# Patient Record
Sex: Male | Born: 1945 | Race: Black or African American | Hispanic: No | Marital: Single | Smoking: Former smoker
Health system: Southern US, Community
[De-identification: ages and names within clinical notes are randomized; demographics above are authoritative.]

## PROBLEM LIST (undated history)

## (undated) DIAGNOSIS — C801 Malignant (primary) neoplasm, unspecified: Secondary | ICD-10-CM

## (undated) DIAGNOSIS — J45909 Unspecified asthma, uncomplicated: Secondary | ICD-10-CM

## (undated) HISTORY — PX: OTHER SURGICAL HISTORY: SHX169

---

## 1972-11-20 HISTORY — PX: CATARACT EXTRACTION: SUR2

## 2019-07-11 ENCOUNTER — Other Ambulatory Visit: Payer: Self-pay | Admitting: Physician Assistant

## 2019-07-11 DIAGNOSIS — R7989 Other specified abnormal findings of blood chemistry: Secondary | ICD-10-CM

## 2019-07-11 DIAGNOSIS — R972 Elevated prostate specific antigen [PSA]: Secondary | ICD-10-CM

## 2019-07-16 ENCOUNTER — Encounter: Payer: Self-pay | Admitting: *Deleted

## 2019-07-16 ENCOUNTER — Telehealth: Payer: Self-pay | Admitting: *Deleted

## 2019-07-16 DIAGNOSIS — Z87891 Personal history of nicotine dependence: Secondary | ICD-10-CM

## 2019-07-16 DIAGNOSIS — Z122 Encounter for screening for malignant neoplasm of respiratory organs: Secondary | ICD-10-CM

## 2019-07-16 NOTE — Telephone Encounter (Signed)
Received referral for initial lung cancer screening scan. Contacted patient and obtained smoking history,(former, quit 2014, 36 pack year) as well as answering questions related to screening process. Patient denies signs of lung cancer such as weight loss or hemoptysis. Patient denies comorbidity that would prevent curative treatment if lung cancer were found. Patient is scheduled for shared decision making visit and CT scan on 07/22/19 at 145pm.

## 2019-07-18 ENCOUNTER — Ambulatory Visit
Admission: RE | Admit: 2019-07-18 | Discharge: 2019-07-18 | Disposition: A | Discharge status: Home / Self Care | Payer: Medicare Other | Source: Ambulatory Visit | Attending: Physician Assistant | Admitting: Physician Assistant

## 2019-07-18 ENCOUNTER — Other Ambulatory Visit: Payer: Self-pay

## 2019-07-18 DIAGNOSIS — N281 Cyst of kidney, acquired: Secondary | ICD-10-CM | POA: Insufficient documentation

## 2019-07-18 DIAGNOSIS — R972 Elevated prostate specific antigen [PSA]: Secondary | ICD-10-CM | POA: Diagnosis present

## 2019-07-18 DIAGNOSIS — R7989 Other specified abnormal findings of blood chemistry: Secondary | ICD-10-CM | POA: Diagnosis not present

## 2019-07-21 ENCOUNTER — Ambulatory Visit: Payer: Medicare Other

## 2019-07-22 ENCOUNTER — Ambulatory Visit
Admission: RE | Admit: 2019-07-22 | Discharge: 2019-07-22 | Disposition: A | Discharge status: Home / Self Care | Payer: Medicare Other | Source: Ambulatory Visit | Attending: Nurse Practitioner | Admitting: Nurse Practitioner

## 2019-07-22 ENCOUNTER — Other Ambulatory Visit: Payer: Self-pay

## 2019-07-22 ENCOUNTER — Inpatient Hospital Stay: Payer: Medicare Other | Attending: Nurse Practitioner | Admitting: Oncology

## 2019-07-22 DIAGNOSIS — Z87891 Personal history of nicotine dependence: Secondary | ICD-10-CM | POA: Insufficient documentation

## 2019-07-22 DIAGNOSIS — Z122 Encounter for screening for malignant neoplasm of respiratory organs: Secondary | ICD-10-CM | POA: Insufficient documentation

## 2019-07-22 NOTE — Progress Notes (Signed)
Virtual Visit via Video Note  I connected with@ on 07/22/19 at  1:45 PM EDT by a video enabled telemedicine application and verified that I am speaking with the correct person using two identifiers.  Location: Patient: [facility] Provider: Home   I discussed the limitations of evaluation and management by telemedicine and the availability of in person appointments. The patient expressed understanding and agreed to proceed.  I discussed the assessment and treatment plan with the patient. The patient was provided an opportunity to ask questions and all were answered. The patient agreed with the plan and demonstrated an understanding of the instructions.   The patient was advised to call back or seek an in-person evaluation if the symptoms worsen or if the condition fails to improve as anticipated.   In accordance with CMS guidelines, patient has met eligibility criteria including age, absence of signs or symptoms of lung cancer.  Social History   Tobacco Use  . Smoking status: Former Smoker    Packs/day: 0.75    Years: 48.00    Pack years: 36.00    Types: Cigarettes    Quit date: 2014    Years since quitting: 6.6  Substance Use Topics  . Alcohol use: Not on file  . Drug use: Not on file      A shared decision-making session was conducted prior to the performance of CT scan. This includes one or more decision aids, includes benefits and harms of screening, follow-up diagnostic testing, over-diagnosis, false positive rate, and total radiation exposure.   Counseling on the importance of adherence to annual lung cancer LDCT screening, impact of co-morbidities, and ability or willingness to undergo diagnosis and treatment is imperative for compliance of the program.   Counseling on the importance of continued smoking cessation for former smokers; the importance of smoking cessation for current smokers, and information about tobacco cessation interventions have been given to patient including  [place] and 1800 quit [place] programs.   Written order for lung cancer screening with LDCT has been given to the patient and any and all questions have been answered to the best of my abilities.    Yearly follow up will be coordinated by Burgess Estelle, Thoracic Navigator.  I provided 15 minutes of face-to-face video visit time during this encounter, and > 50% was spent counseling as documented under my assessment & plan.   Jacquelin Hawking, NP

## 2019-07-30 ENCOUNTER — Ambulatory Visit: Payer: Self-pay | Admitting: Urology

## 2019-07-31 ENCOUNTER — Encounter: Payer: Self-pay | Admitting: *Deleted

## 2019-08-06 ENCOUNTER — Other Ambulatory Visit: Payer: Self-pay

## 2019-08-06 ENCOUNTER — Encounter: Payer: Self-pay | Admitting: Urology

## 2019-08-06 ENCOUNTER — Ambulatory Visit (INDEPENDENT_AMBULATORY_CARE_PROVIDER_SITE_OTHER): Payer: Medicare Other | Admitting: Urology

## 2019-08-06 VITALS — BP 160/79 | HR 71 | Ht 71.0 in | Wt 244.0 lb

## 2019-08-06 DIAGNOSIS — R972 Elevated prostate specific antigen [PSA]: Secondary | ICD-10-CM

## 2019-08-06 NOTE — Patient Instructions (Signed)
Prostate Cancer Screening  The prostate is a walnut-sized gland that is located below the bladder and in front of the rectum in males. The function of the prostate (prostate gland) is to add fluid to semen during ejaculation. Prostate cancer is the second most common type of cancer in men. A screening test for cancer is a test that is done before cancer symptoms start. Screening can help to identify cancer at an early stage, when the cancer can be treated more easily. The recommended prostate cancer screening test is a blood test called the prostate-specific antigen (PSA) test. PSA is a protein that is made in the prostate. As you age, your prostate naturally produces more PSA. Abnormally high PSA levels may be caused by:  Prostate cancer.  An enlarged prostate that is not caused by cancer (benign prostatic hyperplasia, BPH). This condition is very common in older men.  A prostate gland infection (prostatitis).  Medicines to assist with hair growth, such as finasteride. Depending on the PSA results, you may need more tests, such as:  A physical exam to check the size of your prostate gland.  Blood and imaging tests.  A procedure to remove tissue samples from your prostate gland for testing (biopsy). Who should have screening? Screening recommendations vary based on age.  If you are younger than age 40, screening is not recommended.  If you are age 40-54 and you have no risk factors, screening is not recommended.  If you are younger than age 55, ask your health care provider if you need screening if you have one of these risk factors: ? Being of African-American descent. ? Having a family history of prostate cancer.  If you are age 55-69, talk with your health care provider about your need for screening and how often screening should be done.  If you are older than age 70, screening is not recommended. This is because the risks that screening can cause are greater than the benefits  that it may provide (risks outweigh the benefits). If you are at high risk for prostate cancer, your health care provider may recommend that you have screenings more often or start screening at a younger age. You may be at high risk if you:  Are older than age 55.  Are African-American.  Have a father, brother, or uncle who has been diagnosed with prostate cancer. The risk may be higher if your family member's cancer occurred at an early age. What are the benefits of screening? There is a small chance that screening may lower your risk of dying from prostate cancer. The chance is small because prostate cancer is typically a slow-growing cancer, and most men with prostate cancer die from a different cause. What are the risks of screening? The main risk of prostate cancer screening is diagnosing and treating prostate cancer that would never have caused any symptoms or problems (overdiagnosis and overtreatment). PSA screening cannot tell you if your PSA is high due to cancer or a different cause. A prostate biopsy is the only procedure to diagnose prostate cancer. Even the results of a biopsy may not tell you if your cancer needs to be treated. Slow-growing prostate cancer may not need any treatment other than monitoring, so diagnosing and treating it may cause unnecessary stress or other side effects. A prostate biopsy may also cause:  Infection or fever.  A false negative. This is a result that shows that you do not have prostate cancer when you actually do have prostate cancer. Questions   to ask your health care provider  When should I start prostate cancer screening?  What is my risk for prostate cancer?  How often do I need screening?  What type of screening tests do I need?  How do I get my test results?  What do my results mean?  Do I need treatment? Contact a health care provider if:  You have difficulty urinating.  You have pain when you urinate or ejaculate.  You have  blood in your urine or semen.  You have pain in your back or in the area of your prostate.  You have trouble getting or maintaining an erection (erectile dysfunction, ED). Summary  Prostate cancer is a common type of cancer in men. The prostate (prostate gland) is located below the bladder and in front of the rectum. This gland adds fluid to semen during ejaculation.  Prostate cancer screening may identify cancer at an early stage, when the cancer can be treated more easily.  The prostate-specific antigen (PSA) test is the recommended screening test for prostate cancer.  Discuss the risks and benefits of prostate cancer screening with your health care provider. If you are age 72 or older, screening is likely to lead to more risks than benefits (risks outweigh the benefits). This information is not intended to replace advice given to you by your health care provider. Make sure you discuss any questions you have with your health care provider. Document Released: 08/17/2017 Document Revised: 10/19/2017 Document Reviewed: 08/17/2017 Elsevier Patient Education  2020 [employer].  Prostate Biopsy Instructions  Stop all aspirin or blood thinners (aspirin, plavix, coumadin, warfarin, motrin, ibuprofen, advil, aleve, naproxen, naprosyn) for 7 days prior to the procedure.  If you have any questions about stopping these medications, please contact your primary care physician or cardiologist.  Having a light meal prior to the procedure is recommended.  If you are diabetic or have low blood sugar please bring a small snack or glucose tablet.  A Fleets enema is needed to be purchased over the counter at a local pharmacy and used 2 hours before you scheduled appointment.  This can be purchased over the counter at any pharmacy.  Antibiotics will be administered in the clinic at the time of the procedure unless otherwise specified.    Please bring someone with you to the procedure to drive you home.   A follow up appointment has been scheduled for you to receive the results of the biopsy.  If you have any questions or concerns, please feel free to call the office at [phone] or send a Mychart message.    Thank you, Staff at [facility]

## 2019-08-06 NOTE — Progress Notes (Signed)
08/06/19 2:25 PM   Jack Welch [birth year] [id number]  Referring provider: Crissie Figures, PA-C [address] [place],  [place] [address]  CC: Elevated PSA  HPI: I saw Jack Welch in urology clinic today in consultation from Imagene Gurney, [place] for elevated PSA.  He is a 73 year old African American male who does not get regular medical care who was found to have an elevated PSA of 21 on routine screening.  There are no prior PSA values to review.  He denies any family history of prostate cancer.  He denies any urinary symptoms or gross hematuria.  He has mild ED and is minimally bothered by this.  He lives alone.  He denies any prior abdominal surgeries.  There is no prior cross-sectional imaging to review.  He recently had a renal ultrasound performed for work-up of elevated creatinine which showed no hydronephrosis or masses.  There are no aggravating or alleviating factors.  Severity is moderate.  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  PMH: Emphysema  Surgical History: Denies  Allergies: No Known Allergies  Family History: Family History  Problem Relation Age of Onset  . Prostate cancer Neg Hx   . Bladder Cancer Neg Hx   . Kidney cancer Neg Hx     Social History:  reports that he quit smoking about 6 years ago. His smoking use included cigarettes. He has a 36.00 pack-year smoking history. He has quit using smokeless tobacco. He reports current alcohol use. He reports that he does not use drugs.  ROS: Please see flowsheet from today's date for complete review of systems.  Physical Exam: BP (!) 160/79   Pulse 71   Ht 5\' 11"  (1.803 m)   Wt 244 lb (110.7 kg)   BMI 34.03 kg/m    Constitutional:  Alert and oriented, No acute distress. Cardiovascular: No clubbing, cyanosis, or edema. Respiratory: Normal respiratory effort, no increased work of breathing. GI: Abdomen is soft, nontender, nondistended, no abdominal masses DRE: Unable to palpate prostate  secondary to body habitus Lymph: No cervical or inguinal lymphadenopathy. Skin: No rashes, bruises or suspicious lesions. Neurologic: Grossly intact Psychiatric: Normal mood and affect.  Assessment & Plan:   In summary, the patient is a 73 year old African-American male who does not get regular medical care and denies any other significant medical problems who was found of an isolated elevated PSA of 21.  We reviewed the AUA guidelines that do not recommend routine PSA screening in men over age 25, however with his significantly elevated PSA I did recommend further evaluation.  We reviewed the implications of an elevated PSA and the uncertainty surrounding it. In general, a man's PSA increases with age and is produced by both normal and cancerous prostate tissue. The differential diagnosis for elevated PSA includes BPH, prostate cancer, infection, recent intercourse/ejaculation, recent urethroscopic manipulation (foley placement/cystoscopy) or trauma, and prostatitis.   Management of an elevated PSA can include observation or prostate biopsy and we discussed this in detail. Our goal is to detect clinically significant prostate cancers, and manage with either active surveillance, surgery, or radiation for localized disease. Risks of prostate biopsy include bleeding, infection (including life threatening sepsis), pain, and lower urinary symptoms. Hematuria, hematospermia, and blood in the stool are all common after biopsy and can persist up to 4 weeks.   -Repeat PSA today, call with results -Pursue prostate biopsy if PSA remains elevated -If PSA significantly downtrending, would repeat PSA in 2 months and trend before pursuing biopsy  [name], [facility] [address], [place], [place] [address] [phone]

## 2019-08-07 ENCOUNTER — Telehealth: Payer: Self-pay | Admitting: Family Medicine

## 2019-08-07 LAB — PSA, TOTAL AND FREE
PSA, Free Pct: 14.1 %
PSA, Free: 3.17 ng/mL
Prostate Specific Ag, Serum: 22.5 ng/mL — ABNORMAL HIGH (ref 0.0–4.0)

## 2019-08-07 NOTE — Telephone Encounter (Signed)
-----   Message from [name], MD sent at 08/07/2019  8:16 AM EDT ----- PSA remains elevated at 22.5, please schedule prostate biopsy and review instructions.  Nickolas Madrid, MD 08/07/2019

## 2019-08-07 NOTE — Telephone Encounter (Signed)
Patient has been scheduled and is aware Jack Welch went over instructions with pt over the phone   [place]

## 2019-08-07 NOTE — Telephone Encounter (Signed)
Informed patient of results and gave Biopsy prep instructions. Appointment needs to be scheduled and sent in mail along with Biopsy instructions.

## 2019-08-14 ENCOUNTER — Other Ambulatory Visit: Payer: Self-pay

## 2019-08-14 ENCOUNTER — Encounter: Payer: Self-pay | Admitting: Urology

## 2019-08-14 ENCOUNTER — Other Ambulatory Visit: Payer: Self-pay | Admitting: Urology

## 2019-08-14 ENCOUNTER — Ambulatory Visit (INDEPENDENT_AMBULATORY_CARE_PROVIDER_SITE_OTHER): Payer: Medicare Other | Admitting: Urology

## 2019-08-14 VITALS — BP 149/82 | HR 100 | Ht 72.0 in | Wt 244.0 lb

## 2019-08-14 DIAGNOSIS — R972 Elevated prostate specific antigen [PSA]: Secondary | ICD-10-CM | POA: Diagnosis not present

## 2019-08-14 MED ORDER — LEVOFLOXACIN 500 MG PO TABS
500.0000 mg | ORAL_TABLET | Freq: Once | ORAL | Status: AC
  Administered 2019-08-14: 500 mg via ORAL

## 2019-08-14 MED ORDER — GENTAMICIN SULFATE 40 MG/ML IJ SOLN
80.0000 mg | Freq: Once | INTRAMUSCULAR | Status: AC
  Administered 2019-08-14: 80 mg via INTRAMUSCULAR

## 2019-08-14 NOTE — Progress Notes (Signed)
   08/14/19  Indication: Elevated PSA  Prostate Biopsy Procedure   Informed consent was obtained, and we discussed the risks of bleeding and infection/sepsis. A time out was performed to ensure correct patient identity.  Pre-Procedure: - Last PSA Level: 22.5 - Gentamicin and levaquin given for antibiotic prophylaxis - Transrectal Ultrasound performed revealing a 34 gm prostate, PSA density 0.65 - No significant hypoechoic or median lobe noted  Procedure: - Prostate block performed using 10 cc 1% lidocaine and biopsies taken from sextant areas, a total of 12 under ultrasound guidance.  Post-Procedure: - Patient tolerated the procedure well - He was counseled to seek immediate medical attention if experiences significant bleeding, fevers, or severe pain - Return in one week to discuss biopsy results  Assessment/ Plan: Will follow up in 1-2 weeks to discuss pathology  Nickolas Madrid, MD 08/14/2019

## 2019-08-20 LAB — ANATOMIC PATHOLOGY REPORT: PDF Image: 0

## 2019-08-29 ENCOUNTER — Other Ambulatory Visit: Payer: Self-pay | Admitting: Urology

## 2019-09-01 ENCOUNTER — Ambulatory Visit (INDEPENDENT_AMBULATORY_CARE_PROVIDER_SITE_OTHER): Payer: Medicare Other | Admitting: Urology

## 2019-09-01 ENCOUNTER — Other Ambulatory Visit: Payer: Self-pay

## 2019-09-01 VITALS — BP 148/79 | HR 100 | Ht 74.0 in | Wt 244.0 lb

## 2019-09-01 DIAGNOSIS — C61 Malignant neoplasm of prostate: Secondary | ICD-10-CM

## 2019-09-01 NOTE — Patient Instructions (Signed)
Prostate Cancer  The prostate is a walnut-sized gland that is involved in the production of semen. It is located below a man's bladder, in front of the rectum. Prostate cancer is the abnormal growth of cells in the prostate gland. What are the causes? The exact cause of this condition is not known. What increases the risk? This condition is more likely to develop in men who:  Are older than age 72.  Are African-American.  Are obese.  Have a family history of prostate cancer.  Have a family history of breast cancer. What are the signs or symptoms? Symptoms of this condition include:  A need to urinate often.  Weak or interrupted flow of urine.  Trouble starting or stopping urination.  Inability to urinate.  Pain or burning during urination.  Painful ejaculation.  Blood in urine or semen.  Persistent pain or discomfort in the lower back, lower abdomen, hips, or upper thighs.  Trouble getting an erection.  Trouble emptying the bladder all the way. How is this diagnosed? This condition can be diagnosed with:  A digital rectal exam. For this exam, a health care provider inserts a gloved finger into the rectum to feel the prostate gland.  A blood test called a prostate-specific antigen (PSA) test.  An imaging test called transrectal ultrasonography.  A procedure in which a sample of tissue is taken from the prostate and examined under a microscope (prostate biopsy). Once the condition is diagnosed, tests will be done to determine how far the cancer has spread. This is called staging the cancer. Staging may involve imaging tests, such as:  A bone scan.  A CT scan.  A PET scan.  An MRI. The stages of prostate cancer are as follows:  Stage I. At this stage, the cancer is found in the prostate only. The cancer is not visible on imaging tests and it is usually found by accident, such as during a prostate surgery.  Stage II. At this stage, the cancer is more advanced  than it is in stage I, but the cancer has not spread outside the prostate.  Stage III. At this stage, the cancer has spread beyond the outer layer of the prostate to nearby tissues. The cancer may be found in the seminal vesicles, which are near the bladder and the prostate.  Stage IV. At this stage, the cancer has spread other parts of the body, such as the lymph nodes, bones, bladder, rectum, liver, or lungs. How is this treated? Treatment for this condition depends on several factors, including the stage of the cancer, your age, personal preferences, and your overall health. Talk with your health care provider about treatment options that are recommended for you. Common treatments include:  Observation for early stage prostate cancer (active surveillance). This involves having exams, blood tests, and in some cases, more biopsies. For some men, this is the only treatment needed.  Surgery. Types of surgeries include: ? Open surgery. In this surgery, a larger incision is made to remove the prostate. ? A laparoscopic prostatectomy. This is a surgery to remove the prostate and lymph nodes through several, small incisions. It is often referred to as a minimally invasive surgery. ? A robotic prostatectomy. This is a surgery to remove the prostate and lymph nodes with the help of a robotic arm that is controlled by a computer. ? Orchiectomy. This is a surgery to remove the testicles. ? Cryosurgery. This is a surgery to freeze and destroy cancer cells.  Radiation treatment. Types   of radiation treatment include: ? External beam radiation. This type aims beams of radiation from outside the body at the prostate to destroy cancerous cells. ? Brachytherapy. This type uses radioactive needles, seeds, wires, or tubes that are implanted into the prostate gland. Like external beam radiation, brachytherapy destroys cancerous cells. An advantage is that this type of radiation limits the damage to surrounding  tissue and has fewer side effects.  High-intensity, focused ultrasonography. This treatment destroys cancer cells by delivering high-energy ultrasound waves to the cancerous cells.  Chemotherapy medicines. This treatment kills cancer cells or stops them from multiplying.  Hormone treatment. This treatment involves taking medicines that act on one of the male hormones (testosterone): ? By stopping your body from producing testosterone. ? By blocking testosterone from reaching cancer cells. Follow these instructions at home:  Take over-the-counter and prescription medicines only as told by your health care provider.  Maintain a healthy diet.  Get plenty of sleep.  Consider joining a support group for men who have prostate cancer. Meeting with a support group may help you learn to cope with the stress of having cancer.  Keep all follow-up visits as told by your health care provider. This is important.  If you have to go to the hospital, notify your cancer specialist (oncologist).  Treatment for prostate cancer may affect sexual function. Continue to have intimate moments with your partner. This may include touching, holding, hugging, and caressing. Contact a health care provider if:  You have trouble urinating.  You have blood in your urine.  You have pain in your hips, back, or chest. Get help right away if:  You have weakness or numbness in your legs.  You cannot control urination or your bowel movements (incontinence).  You have trouble breathing.  You have sudden chest pain.  You have chills or a fever. Summary  The prostate is a walnut-sized gland that is involved in the production of semen. It is located below a man's bladder, in front of the rectum. Prostate cancer is the abnormal growth of cells in the prostate gland.  Treatment for this condition depends on several factors, including the stage of the cancer, your age, personal preferences, and your overall health.  Talk with your health care provider about treatment options that are recommended for you.  Consider joining a support group for men who have prostate cancer. Meeting with a support group may help you learn to cope with the stress of having cancer. This information is not intended to replace advice given to you by your health care provider. Make sure you discuss any questions you have with your health care provider. Document Released: 11/06/2005 Document Revised: 10/19/2017 Document Reviewed: 07/17/2016 Elsevier Patient Education  2020 [employer]  

## 2019-09-01 NOTE — Progress Notes (Signed)
   09/01/2019 11:55 AM   Jack Welch 31-Aug-1946 [id number]  Reason for visit: Discuss prostate biopsy results  HPI: I saw Jack Welch back in urology clinic to discuss his prostate biopsy results.  Briefly, he is a 73 year old otherwise relatively healthy African-American male who was found to have a elevated PSA of 22.5 on routine screening.  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  Pathology showed Gleason score 5+3 = 8 prostate cancer(grade group 4) which, along with his PSA greater than 20, puts him in the high risk category.  Perineural invasion was present.  We had a lengthy conversation today about the patient's new diagnosis of prostate cancer.  We reviewed the risk classifications per the AUA guidelines including very low risk, low risk, intermediate risk, and high risk disease, and the need for additional staging imaging with CT and bone scan in patients with unfavorable intermediate risk and high risk disease.  I explained that his life expectancy, clinical stage, Gleason score, PSA, and other co-morbidities influence treatment strategies.  We discussed the roles of active surveillance, radiation therapy, surgical therapy with robotic prostatectomy, and hormone therapy with androgen deprivation.  We discussed that patients urinary symptoms also impact treatment strategy, as patients with severe lower urinary tract symptoms may have significant worsening or even develop urinary retention after undergoing radiation.  In regards to surgery, we discussed robotic prostatectomy +/- lymphadenectomy at length.  The procedure takes 3 to 4 hours, and patient's typically discharge home on post-op day #1.  A Foley catheter is left in place for 7 to 10 days to allow for healing of the vesicourethral anastomosis.  There is a small risk of bleeding, infection, damage to surrounding structures or bowel, hernia, DVT/PE, or  serious cardiac or pulmonary complications.  We discussed at length post-op side effects including erectile dysfunction, and the importance of pre-operative erectile function on long-term outcomes.  Even with a nerve sparing approach, there is an approximately 25% rate of permanent erectile dysfunction.  We also discussed postop urinary incontinence at length.  We expect patients to have stress incontinence post-operatively that will improve over period of weeks to months.  Less than 10% of men will require a pad at 1 year after surgery.  Patients will need to avoid heavy lifting and strenuous activity for 3 to 4 weeks, but most men return to their baseline activity status by 6 weeks.  In summary, Jack Welch is a 73 y.o. man with newly diagnosed high risk prostate cancer.   Staging imaging with CT abdomen pelvis and bone scan ordered, will call with results.  With his older age, likely would recommend radiation and 2 years ADT if no evidence of metastatic disease on staging imaging.  If staging imaging positive, will refer to oncology for systemic treatment/ADT  A total of 25 minutes were spent face-to-face with the patient, greater than 50% was spent in patient education, counseling, and coordination of care regarding new diagnosis of prostate cancer.  [name], [facility] [address] [place], [place] [address] [phone]

## 2019-09-08 ENCOUNTER — Other Ambulatory Visit: Payer: Medicare Other | Admitting: Urology

## 2019-09-23 ENCOUNTER — Other Ambulatory Visit: Payer: Self-pay

## 2019-09-23 ENCOUNTER — Encounter
Admission: RE | Admit: 2019-09-23 | Discharge: 2019-09-23 | Disposition: A | Discharge status: Home / Self Care | Payer: Medicare Other | Source: Ambulatory Visit | Attending: Urology | Admitting: Urology

## 2019-09-23 ENCOUNTER — Ambulatory Visit
Admission: RE | Admit: 2019-09-23 | Discharge: 2019-09-23 | Disposition: A | Discharge status: Home / Self Care | Payer: Medicare Other | Source: Ambulatory Visit | Attending: Urology | Admitting: Urology

## 2019-09-23 DIAGNOSIS — C61 Malignant neoplasm of prostate: Secondary | ICD-10-CM | POA: Insufficient documentation

## 2019-09-23 HISTORY — DX: Malignant (primary) neoplasm, unspecified: C80.1

## 2019-09-23 HISTORY — DX: Unspecified asthma, uncomplicated: J45.909

## 2019-09-23 LAB — POCT I-STAT CREATININE: Creatinine, Ser: 1.6 mg/dL — ABNORMAL HIGH (ref 0.61–1.24)

## 2019-09-23 MED ORDER — TECHNETIUM TC 99M MEDRONATE IV KIT
21.5980 | PACK | Freq: Once | INTRAVENOUS | Status: AC | PRN
  Administered 2019-09-23: 21.598 via INTRAVENOUS

## 2019-09-23 MED ORDER — IOHEXOL 300 MG/ML  SOLN
100.0000 mL | Freq: Once | INTRAMUSCULAR | Status: AC | PRN
  Administered 2019-09-23: 100 mL via INTRAVENOUS

## 2019-09-25 ENCOUNTER — Encounter: Payer: Self-pay | Admitting: Urology

## 2019-09-25 ENCOUNTER — Other Ambulatory Visit: Payer: Self-pay

## 2019-09-25 ENCOUNTER — Ambulatory Visit (INDEPENDENT_AMBULATORY_CARE_PROVIDER_SITE_OTHER): Payer: Medicare Other | Admitting: Urology

## 2019-09-25 VITALS — BP 148/94 | HR 82 | Ht 74.0 in | Wt 243.0 lb

## 2019-09-25 DIAGNOSIS — C61 Malignant neoplasm of prostate: Secondary | ICD-10-CM

## 2019-09-25 NOTE — Patient Instructions (Addendum)
Follow up in one week with Eligard   External Beam Radiation Therapy External beam radiation therapy is a type of radiation treatment. This type of radiation therapy can deliver radiation to a fairly large area. This is the most common type of radiation therapy for cancer. This therapy may be done to:  Treat cancer by: ? Destroying cancer cells. Radiation delivered during the treatment damages cancer cells. It may also damage normal cells, but normal cells have the DNA to repair themselves while cancer cells do not. ? Helping with symptoms of your cancer. ? Stopping the growth of any remaining cancer cells after surgery. ? Preventing cancer cells from growing in areas that do not have cancer (prophylactic radiation therapy).  Treat or shrink a tumor.  Reduce pain (palliative therapy). The amount of radiation you will receive and the length of therapy depend on your medical condition. You should not feel the radiation being delivered or any pain during your therapy. Let your health care provider know about:  Any allergies you have.  All medicines you are taking, including vitamins, herbs, eye drops, creams, and over-the-counter medicines.  Any problems you or family members have had with anesthetic medicines.  Any blood disorders you have.  Any surgeries you have had.  Any medical conditions you have.  Whether you are pregnant or may be pregnant. What are the risks? Generally, this is a safe procedure. However, radiation therapy can place a person at a higher risk for a second cancer later in life. Most people experience side effects from the therapy. Side effects depend on the amount of radiation and the part of the body that was exposed to radiation. The most common side effects include:  Skin changes.  Hair loss.  Fatigue.  Nausea and vomiting. What happens before the procedure?  There will be a planning session (simulation). During the session: ? Your health care  provider will plan exactly where the radiation will be delivered (treatment field). ? You will be positioned for your therapy. The goal is to have a position that can be reproduced for each therapy session. ? Temporary marks may be drawn on your body. Permanent marks may also be drawn on your body in order for you to be positioned the same way for each therapy session. ? A tool that holds a body part in place (immobilization device) may be used to keep the area of treatment in the correct position.  Follow instructions from your health care provider about eating or drinking restrictions.  Ask your health care provider about changing or stopping your regular medicines. This is especially important if you are taking diabetes medicines or blood thinners. What happens during the procedure?   You will either lie on a table or sit in a chair in the position determined for your therapy.  You may have a heavy shield placed on you to protect tissues and organs that are not being treated.  The radiation machine (linear accelerator) will move around you to deliver the radiation in exact doses from different angles. The machine will not touch you. The procedure may vary among health care providers and hospitals. What happens after the procedure?  You may return to your normal routine including diet, activities, and medicines as told by your health care provider.  You may want to have someone take you home from the hospital or clinic. Summary  External beam radiation therapy is a type of radiation treatment for cancer.  The amount of radiation you will receive  and the length of therapy depend on your medical condition.  Most people experience side effects from the therapy. Side effects depend on the amount of radiation and the part of the body that was exposed to radiation. This information is not intended to replace advice given to you by your health care provider. Make sure you discuss any  questions you have with your health care provider. Document Released: 03/25/2009 Document Revised: 11/07/2017 Document Reviewed: 11/06/2016 Elsevier Patient Education  2020 [place] for Prostate Cancer Brachytherapy for prostate cancer is radiation treatment that is placed inside of the prostate (prostate gland). There are several types of brachytherapy:  Low-dose rate (LDR) therapy. This may involve temporary implants or permanent radioactive seed or pellet implants. The radiation does not travel far from the prostate, which means that healthy, noncancerous tissues around the prostate receive only a small dose of radiation. This helps to protect those tissues from injury. This type of treatment may be followed by a course of external beam radiation. ? Temporary low-dose implants are left in the prostate for 1-7 days. The implants are needles, applicators, or thin, plastic tubes (catheters) that contain radioactive material. You will need to stay in the hospital while the implant is in place. ? Permanent low-dose implants (seeds or pellets) are injected into the prostate, and they work for up to one year after they are inserted. They are left in place and are not removed.  High-dose rate (HDR) therapy. This is given through needles, applicators, or catheters that contain radioactive material. The tubes are removed after treatment, and no radiation is left in the prostate. This type of treatment may be followed by a course of external beam radiation. Tell a health care provider about:  Any allergies you have.  All medicines you are taking, including vitamins, herbs, eye drops, creams, and over-the-counter medicines.  Any problems you or family members have had with anesthetic medicines.  Any surgeries you have had.  Any blood disorders you have.  Any medical conditions you have. What are the risks? Generally, this is a safe procedure. However, problems may occur,  including:  Inflammation of the rectum.  Problems getting or keeping an erection (erectile dysfunction).  Trouble urinating.  Diarrhea.  Bleeding.  Loss of bowel control. What happens before the procedure? Staying hydrated Follow instructions from your health care provider about hydration, which may include:  Up to 2 hours before the procedure - you may continue to drink clear liquids, such as water, clear fruit juice, black coffee, and plain tea. Eating and drinking Follow instructions from your health care provider about eating and drinking, which may include:  8 hours before the procedure - stop eating heavy meals or foods such as meat, fried foods, or fatty foods.  6 hours before the procedure - stop eating light meals or foods, such as toast or cereal.  6 hours before the procedure - stop drinking milk or drinks that contain milk.  2 hours before the procedure - stop drinking clear liquids. Medicines  Ask your health care provider about: ? Changing or stopping your regular medicines. This is especially important if you are taking diabetes medicines or blood thinners. ? Taking medicines such as aspirin and ibuprofen. These medicines can thin your blood. Do not take these medicines before your procedure if your health care provider instructs you not to.  You may be given antibiotic medicine to help prevent infection. General instructions  Plan to have someone take you home  from the hospital or clinic.  If you will be going home right after the procedure, plan to have someone with you for 24 hours.  You may have imaging tests done, including an ultrasound, CT scan, or MRI.  You may have blood tests done.  You may have a test to check the electrical signals in your heart (electrocardiogram).  You may need to take medicine to clean out your bowel (bowel prep). What happens during the procedure?  To lower your risk of infection: ? Your health care team will wash or  sanitize their hands. ? Your skin will be washed with soap. ? Hair may be removed from the surgical area.  An IV will be inserted into one of your veins.  You will be given one or more of the following: ? A medicine to help you relax (sedative). ? A medicine to numb the area (local anesthetic). ? A medicine to make you fall asleep (general anesthetic).  You may have a thin, plastic tube (catheter) inserted to drain your bladder.  If you are receiving brachytherapy with implants: ? A needle, applicator, or catheter will be inserted into the prostate. It will be inserted through a body cavity, such as the rectum, or through the tissue between the testicles and the anus (perineum). ? An X-ray, ultrasound, MRI, or CT scan will be used to guide the catheter or applicator toward the prostate. ? Radioactive seeds, wires, or ribbons will be fed through the catheter or applicator. ? If the high-dose method is used: ? The radioactive wires or ribbons will be left in for a few minutes and then removed. ? Once the treatment is finished, the catheter or applicator will be removed. ? If the low-dose method is used, the implant will stay in place for 1-7 days. ? You will remain in the hospital while the implant is in place. ? Once the treatment is finished, the radioactive material and catheter will be removed.  If you are receiving permanent, low-dose brachytherapy: ? Small, radioactive seeds or pellets will be injected into your prostate. This may be done through a catheter, needle, or applicator. ? The catheter or applicator will be removed, leaving the seeds in the prostate. The procedure may vary among health care providers and hospitals. What happens after the procedure?  Your blood pressure, heart rate, breathing rate, and blood oxygen level will be monitored until the medicines you were given have worn off.  Do not drive for 24 hours if you were given a sedative. Summary  Brachytherapy  for prostate cancer is radiation treatment placed inside of the prostate (prostate gland).  There are several types of brachytherapy for prostate cancer, including low-dose temporary treatment, low-dose permanent treatment, and high-dose temporary treatment.  Temporary low-dose implants are left in the prostate for 1-7 days.  Permanent low-dose implants are injected into the prostate and left in place. They work for up to one year after they are inserted.  Permanent high-dose therapy is given through tubes that contain radioactive material. The tubes are removed after treatment, and no radiation is left in the prostate. This information is not intended to replace advice given to you by your health care provider. Make sure you discuss any questions you have with your health care provider. Document Released: 04/16/2006 Document Revised: 10/19/2017 Document Reviewed: 11/15/2016 Elsevier Patient Education  2020 [employer].

## 2019-09-25 NOTE — Progress Notes (Signed)
   09/25/2019 5:14 PM   Jack Welch 07/15/1946 [id number]  Reason for visit: Follow up high risk prostate cancer  HPI: Jack Welch is a 73 year old African-American male who was found to have a elevated PSA of 22.5 on routine screening.  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  Pathology showed Gleason score 5+3 = 8 prostate cancer(grade group 4) which, along with his PSA greater than 20, puts him in the high risk category. Perineural invasion was present.  Staging imaging with bone scan and CT were both negative for any metastatic disease.  He has some mild erectile dysfunction at baseline, and denies any urinary symptoms.  We had a lengthy conversation today about the patient's new diagnosis of prostate cancer.  We reviewed the risk classifications per the AUA guidelines including very low risk, low risk, intermediate risk, and high risk disease, and the need for additional staging imaging with CT and bone scan in patients with unfavorable intermediate risk and high risk disease.  I explained that his life expectancy, clinical stage, Gleason score, PSA, and other co-morbidities influence treatment strategies.  We discussed the roles of active surveillance, radiation therapy, surgical therapy with robotic prostatectomy, and hormone therapy with androgen deprivation.  We discussed that patients urinary symptoms also impact treatment strategy, as patients with severe lower urinary tract symptoms may have significant worsening or even develop urinary retention after undergoing radiation.  In regards to surgery, we discussed robotic prostatectomy +/- lymphadenectomy at length.  The procedure takes 3 to 4 hours, and patient's typically discharge home on post-op day #1.  A Foley catheter is left in place for 7 to 10 days to allow for healing of the vesicourethral anastomosis.  There is a small risk of bleeding,  infection, damage to surrounding structures or bowel, hernia, DVT/PE, or serious cardiac or pulmonary complications.  We discussed at length post-op side effects including erectile dysfunction, and the importance of pre-operative erectile function on long-term outcomes.  Even with a nerve sparing approach, there is an approximately 25% rate of permanent erectile dysfunction.  We also discussed postop urinary incontinence at length.  We expect patients to have stress incontinence post-operatively that will improve over period of weeks to months.  Less than 10% of men will require a pad at 1 year after surgery.  Patients will need to avoid heavy lifting and strenuous activity for 3 to 4 weeks, but most men return to their baseline activity status by 6 weeks.  In summary, Jack Welch is a 73 y.o. man with newly diagnosed high risk prostate cancer.  With his age I think he would be a better candidate for radiation over surgery.  He would like to pursue radiation and ADT.  Referral placed to Dr. Baruch Gouty for radiation RTC 1 week to initiate ADT (2-year duration)  A total of 25 minutes were spent face-to-face with the patient, greater than 50% was spent in patient education, counseling, and coordination of care regarding high risk localized prostate cancer and treatment options.  [name], [facility] [address] [place], [place] [address] [phone]

## 2019-09-29 ENCOUNTER — Telehealth: Payer: Self-pay | Admitting: Urology

## 2019-09-29 NOTE — Telephone Encounter (Signed)
PA for Eligard [id number] 09-29-19 thru 09-28-2020 Patient is coming over after his app at the cancer center on 10-02-19   [facility]

## 2019-09-29 NOTE — Telephone Encounter (Signed)
-----   Message from Shanon Ace, Rudy sent at 09/25/2019  3:53 PM EST ----- Regarding: Eligard Dr. Diamantina Providence would like this patient to get Eligard in one week

## 2019-10-01 ENCOUNTER — Other Ambulatory Visit: Payer: Self-pay

## 2019-10-01 ENCOUNTER — Institutional Professional Consult (permissible substitution): Payer: Medicare Other | Admitting: Radiation Oncology

## 2019-10-01 NOTE — Progress Notes (Signed)
10/02/2019 9:24 PM   Jack Welch 04/19/1946 [id number]  Referring provider: Crissie Figures, PA-C [address] [place],  [place] [address]  Chief Complaint  Patient presents with  . Follow-up    HPI: Jack Welch is a 73 year old male with high grade prostate cancer who presents today to begin his ADT therapy.    He was diagnosed with high grade prostate cancer after undergoing a prostate biopsy on 08/14/2019 with Dr. Diamantina Providence.  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 Pathology showed Gleason score 5+3 = 8 prostate cancer(grade group 4)which,along with his PSA greater than 20,puts him in the high risk category. Perineural invasion was present.  Staging imaging with bone scan and CT were both negative for any metastatic disease.    He was seen by Dr. Donella Stade this morning.  He will have two years of ADT in addition to external beam treatment to his prostate and pelvic nodes plus I-125 interstitial implant for boost.    PMH: Past Medical History:  Diagnosis Date  . Asthma   . Cancer [facility])     Surgical History: Past Surgical History:  Procedure Laterality Date  . none      Home Medications:  Allergies as of 10/02/2019   No Known Allergies     Medication List       Accurate as of October 02, 2019 11:59 PM. If you have any questions, ask your nurse or doctor.        albuterol 108 (90 Base) MCG/ACT inhaler Commonly known as: VENTOLIN HFA INL 2 PFS PO Q 4 TO 6 H PRF WHZ   fluticasone 50 MCG/ACT nasal spray Commonly known as: FLONASE SHAKE LQ AND U 2 SPRAYS IEN D FOR ALLERGIES       Allergies: No Known Allergies  Family History: Family History  Problem Relation Age of Onset  . Prostate cancer Neg Hx   . Bladder Cancer Neg Hx   . Kidney cancer Neg Hx     Social History:  reports that he quit smoking about 6 years ago. His smoking use included cigarettes. He has a 36.00  pack-year smoking history. He has quit using smokeless tobacco. He reports current alcohol use. He reports that he does not use drugs.  ROS: UROLOGY Frequent Urination?: No Hard to postpone urination?: No Burning/pain with urination?: No Get up at night to urinate?: No Leakage of urine?: No Urine stream starts and stops?: No Trouble starting stream?: No Do you have to strain to urinate?: No Blood in urine?: No Urinary tract infection?: No Sexually transmitted disease?: No Injury to kidneys or bladder?: No Painful intercourse?: No Weak stream?: No Erection problems?: No Penile pain?: No  Gastrointestinal Nausea?: No Vomiting?: No Indigestion/heartburn?: No Diarrhea?: No Constipation?: No  Constitutional Fever: No Night sweats?: No Weight loss?: No Fatigue?: No  Skin Skin rash/lesions?: No Itching?: No  Eyes Blurred vision?: No Double vision?: No  Ears/Nose/Throat Sore throat?: No Sinus problems?: No  Hematologic/Lymphatic Swollen glands?: No Easy bruising?: No  Cardiovascular Leg swelling?: No Chest pain?: No  Respiratory Cough?: No Shortness of breath?: No  Endocrine Excessive thirst?: No  Musculoskeletal Back pain?: No Joint pain?: No  Neurological Headaches?: No Dizziness?: No  Psychologic Depression?: No Anxiety?: No  Physical Exam: BP 140/80   Pulse 74   Ht 6\' 2"  (1.88 m)   Wt 243 lb (110.2 kg)   BMI 31.20 kg/m  Constitutional:  Well nourished. Alert and oriented, No acute distress. HEENT: [place] AT, moist mucus membranes.  Trachea midline, no masses. Cardiovascular: No clubbing, cyanosis, or edema. Respiratory: Normal respiratory effort, no increased work of breathing. Neurologic: Grossly intact, no focal deficits, moving all 4 extremities. Psychiatric: Normal mood and affect.  Laboratory Data: No results found for: WBC, HGB, HCT, MCV, PLT  Lab Results  Component Value Date   CREATININE 1.60 (H) 09/23/2019    No results  found for: PSA  No results found for: TESTOSTERONE  No results found for: HGBA1C  No results found for: TSH  No results found for: CHOL, HDL, CHOLHDL, VLDL, LDLCALC  No results found for: AST No results found for: ALT No components found for: ALKALINEPHOPHATASE No components found for: BILIRUBINTOTAL  No results found for: ESTRADIOL  Urinalysis No results found for: COLORURINE, APPEARANCEUR, LABSPEC, PHURINE, GLUCOSEU, HGBUR, BILIRUBINUR, KETONESUR, PROTEINUR, UROBILINOGEN, NITRITE, LEUKOCYTESUR  I have reviewed the labs.   Assessment & Plan:    1. Prostate cancer ([place]) We did discuss the risks of ADT therapy, such as: weight gain, ED, decrease in libido, fatigue, hot flashes, back pain, joint pain, chills, constipation, heart disease, itching where the injection is given and pain where the injection is given.  We also discussed bone health and I have advised the patient to start Calcium 600 gm twice daily and Vitamin D 200 mg twice daily. Received 43-month Eligard injection today Return to clinic in 6 months for his next ADT injection    Return in about 6 months (around 03/31/2020) for Eligard/Lupron injection .  These notes generated with voice recognition software. I apologize for typographical errors.  Zara Council, PA-C  [facility] [address]  [place], [place] [address] [phone]

## 2019-10-02 ENCOUNTER — Encounter: Payer: Self-pay | Admitting: Radiation Oncology

## 2019-10-02 ENCOUNTER — Ambulatory Visit (INDEPENDENT_AMBULATORY_CARE_PROVIDER_SITE_OTHER): Payer: Medicare Other | Admitting: Urology

## 2019-10-02 ENCOUNTER — Ambulatory Visit
Admission: RE | Admit: 2019-10-02 | Discharge: 2019-10-02 | Disposition: A | Discharge status: Home / Self Care | Payer: Medicare Other | Source: Ambulatory Visit | Attending: Radiation Oncology | Admitting: Radiation Oncology

## 2019-10-02 ENCOUNTER — Encounter: Payer: Self-pay | Admitting: Urology

## 2019-10-02 ENCOUNTER — Other Ambulatory Visit: Payer: Self-pay

## 2019-10-02 VITALS — BP 152/73 | HR 63 | Resp 18 | Wt 243.3 lb

## 2019-10-02 VITALS — BP 140/80 | HR 74 | Ht 74.0 in | Wt 243.0 lb

## 2019-10-02 DIAGNOSIS — J45909 Unspecified asthma, uncomplicated: Secondary | ICD-10-CM | POA: Diagnosis not present

## 2019-10-02 DIAGNOSIS — Z87891 Personal history of nicotine dependence: Secondary | ICD-10-CM | POA: Insufficient documentation

## 2019-10-02 DIAGNOSIS — C61 Malignant neoplasm of prostate: Secondary | ICD-10-CM | POA: Insufficient documentation

## 2019-10-02 DIAGNOSIS — Z79899 Other long term (current) drug therapy: Secondary | ICD-10-CM | POA: Diagnosis not present

## 2019-10-02 DIAGNOSIS — R351 Nocturia: Secondary | ICD-10-CM | POA: Insufficient documentation

## 2019-10-02 MED ORDER — LEUPROLIDE ACETATE (6 MONTH) 45 MG ~~LOC~~ KIT
45.0000 mg | PACK | Freq: Once | SUBCUTANEOUS | Status: AC
  Administered 2019-10-02: 45 mg via SUBCUTANEOUS

## 2019-10-02 NOTE — Patient Instructions (Signed)
Please start calcium 600 mg twice daily with food and vitamin D 400 mg with food once daily.

## 2019-10-02 NOTE — Consult Note (Signed)
NEW PATIENT EVALUATION  Name: Jack Welch  MRN: [id number]  Date:   10/02/2019     DOB: 03-30-1946   This 73 y.o. 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  REFERRING PHYSICIAN: Crissie Figures, PA-C  CHIEF COMPLAINT:  Chief Complaint  Patient presents with  . Prostate Cancer    Initial consultation    DIAGNOSIS: The encounter diagnosis was Malignant neoplasm of prostate ([place]).   PREVIOUS INVESTIGATIONS:  CT scans and bone scan reviewed Pathology reviewed Clinical notes reviewed  HPI: Patient is a 72 year old male who presented with an elevated PSA by his PMD in the 21 range.  This prompted urologic consultation patient underwent transrectal ultrasound-guided biopsy.  Pathology showed 5 out of 12 cores positive for mixture of Gleason 8 (3+5) as well as 5+3).  There also was Gleason 7 (3+4) noted.  Perineural invasion was present.  Bone scan was performed showing no evidence of metastatic disease to the bone.  CT scan of the abdomen pelvis showed no evidence of lymphadenopathy did have a 13 mm left adrenal nodule suggestive of benign adrenal adenoma.  Digital rectal exam showed no evidence of nodularity or mass although the exam was difficult based on the patient's body habitus.  He does have some slight urinary dribbling nocturia x2-3.  No bowel complaints.  He is having no bone pain.  He is now referred to radiation oncology for consideration of treatment.  PLANNED TREATMENT REGIMEN: ADT plus external beam treatment to prostate and pelvic nodes plus I-125 interstitial implant for boost  PAST MEDICAL HISTORY:  has a past medical history of Asthma and Cancer ([place]).    PAST SURGICAL HISTORY:  Past Surgical History:  Procedure Laterality Date  . none      FAMILY HISTORY: family history is not on file.  SOCIAL HISTORY:  reports that he quit smoking about 6 years ago. His  smoking use included cigarettes. He has a 36.00 pack-year smoking history. He has quit using smokeless tobacco. He reports current alcohol use. He reports that he does not use drugs.  ALLERGIES: Patient has no known allergies.  MEDICATIONS:  Current Outpatient Medications  Medication Sig Dispense Refill  . albuterol (VENTOLIN HFA) 108 (90 Base) MCG/ACT inhaler INL 2 PFS PO Q 4 TO 6 H PRF WHZ    . fluticasone (FLONASE) 50 MCG/ACT nasal spray SHAKE LQ AND U 2 SPRAYS IEN D FOR ALLERGIES     No current facility-administered medications for this encounter.     ECOG PERFORMANCE STATUS:  0 - Asymptomatic  REVIEW OF SYSTEMS: Patient denies any weight loss, fatigue, weakness, fever, chills or night sweats. Patient denies any loss of vision, blurred vision. Patient denies any ringing  of the ears or hearing loss. No irregular heartbeat. Patient denies heart murmur or history of fainting. Patient denies any chest pain or pain radiating to her upper extremities. Patient denies any shortness of breath, difficulty breathing at night, cough or hemoptysis. Patient denies any swelling in the lower legs. Patient denies any nausea vomiting, vomiting of blood, or coffee ground material in the vomitus. Patient denies any stomach pain. Patient states has had normal bowel movements no significant constipation or diarrhea. Patient denies any dysuria, hematuria or significant nocturia. Patient denies any problems walking, swelling in the joints or loss of balance. Patient denies any skin changes, loss of hair or loss of weight. Patient denies any excessive  worrying or anxiety or significant depression. Patient denies any problems with insomnia. Patient denies excessive thirst, polyuria, polydipsia. Patient denies any swollen glands, patient denies easy bruising or easy bleeding. Patient denies any recent infections, allergies or URI. Patient "s visual fields have not changed significantly in recent time.   PHYSICAL  EXAM: BP (!) 152/73 (BP Location: Left Arm)   Pulse 63   Resp 18   Wt 243 lb 4.8 oz (110.4 kg)   BMI 31.24 kg/m  Well-developed well-nourished patient in NAD. HEENT reveals PERLA, EOMI, discs not visualized.  Oral cavity is clear. No oral mucosal lesions are identified. Neck is clear without evidence of cervical or supraclavicular adenopathy. Lungs are clear to A&P. Cardiac examination is essentially unremarkable with regular rate and rhythm without murmur rub or thrill. Abdomen is benign with no organomegaly or masses noted. Motor sensory and DTR levels are equal and symmetric in the upper and lower extremities. Cranial nerves II through XII are grossly intact. Proprioception is intact. No peripheral adenopathy or edema is identified. No motor or sensory levels are noted. Crude visual fields are within normal range.  LABORATORY DATA: Pathology report reviewed    RADIOLOGY RESULTS: Bone scan and CT scans reviewed   IMPRESSION: Stage IIb (T1 cN0 M0) Gleason 8 (5+3) adenocarcinoma the prostate in 73 year old male presented with a PSA of 21  PLAN: I have run the [facility] nomogram.  This shows only a 16% chance of organ confined disease as well as a 32% lymph node involvement based on Gleason score of 3+5) current PSA and stage.  I believe he would best benefit from trimodality treatment including androgen deprivation therapy plus external beam treatment to his prostate and pelvic nodes plus I-125 interstitial implant for boost.  Risks and benefits of treatment including increased lower urinary tract symptoms fatigue alteration of blood counts possible diarrhea.  Also reviewed briefly the side effects of implant and radiation safety precautions.  I have personally set up and ordered CT simulation for early next week.  Patient comprehends my treatment plan well.  He will start androgen deprivation today and is seeing urologist office for that.  I would like to take this opportunity to  thank you for allowing me to participate in the care of your patient.[name], MD

## 2019-10-03 NOTE — Progress Notes (Signed)
Eligard SubQ Injection   Due to Prostate Cancer patient is present today for a Eligard Injection.  Medication: Eligard 6 month Dose: 45 mg  Location: right  Lot: [id number] Exp: 05/09/2021  Patient tolerated well, no complications were noted  Performed by: Elberta Leatherwood, CMA  Per Zara Council patient is to continue therapy for 6 months - 1 year. Patient's next follow up was scheduled for 03/31/2020. This appointment was scheduled using wheel and given to patient today along with reminder continue on Vitamin D 800-1000iu and Calium 1000-1200mg  daily while on Androgen Deprivation Therapy.

## 2019-10-06 ENCOUNTER — Ambulatory Visit
Admission: RE | Admit: 2019-10-06 | Discharge: 2019-10-06 | Disposition: A | Discharge status: Home / Self Care | Payer: Medicare Other | Source: Ambulatory Visit | Attending: Radiation Oncology | Admitting: Radiation Oncology

## 2019-10-06 ENCOUNTER — Other Ambulatory Visit: Payer: Self-pay

## 2019-10-06 DIAGNOSIS — C61 Malignant neoplasm of prostate: Secondary | ICD-10-CM | POA: Diagnosis present

## 2019-10-06 DIAGNOSIS — Z510 Encounter for antineoplastic radiation therapy: Secondary | ICD-10-CM | POA: Diagnosis not present

## 2019-10-07 DIAGNOSIS — Z510 Encounter for antineoplastic radiation therapy: Secondary | ICD-10-CM | POA: Diagnosis not present

## 2019-10-10 ENCOUNTER — Other Ambulatory Visit: Payer: Self-pay | Admitting: *Deleted

## 2019-10-10 ENCOUNTER — Other Ambulatory Visit: Payer: Self-pay | Admitting: Radiology

## 2019-10-10 DIAGNOSIS — C61 Malignant neoplasm of prostate: Secondary | ICD-10-CM

## 2019-10-13 ENCOUNTER — Other Ambulatory Visit: Payer: Self-pay | Admitting: Radiology

## 2019-10-13 DIAGNOSIS — C61 Malignant neoplasm of prostate: Secondary | ICD-10-CM

## 2019-10-14 ENCOUNTER — Ambulatory Visit
Admission: RE | Admit: 2019-10-14 | Discharge: 2019-10-14 | Disposition: A | Discharge status: Home / Self Care | Payer: Medicare Other | Source: Ambulatory Visit | Attending: Radiation Oncology | Admitting: Radiation Oncology

## 2019-10-14 ENCOUNTER — Other Ambulatory Visit: Payer: Self-pay

## 2019-10-20 ENCOUNTER — Ambulatory Visit
Admission: RE | Admit: 2019-10-20 | Discharge: 2019-10-20 | Disposition: A | Discharge status: Home / Self Care | Payer: Medicare Other | Source: Ambulatory Visit | Attending: Radiation Oncology | Admitting: Radiation Oncology

## 2019-10-20 ENCOUNTER — Other Ambulatory Visit: Payer: Self-pay

## 2019-10-20 DIAGNOSIS — Z510 Encounter for antineoplastic radiation therapy: Secondary | ICD-10-CM | POA: Diagnosis not present

## 2019-10-21 ENCOUNTER — Ambulatory Visit
Admission: RE | Admit: 2019-10-21 | Discharge: 2019-10-21 | Disposition: A | Discharge status: Home / Self Care | Payer: Medicare Other | Source: Ambulatory Visit | Attending: Radiation Oncology | Admitting: Radiation Oncology

## 2019-10-21 ENCOUNTER — Other Ambulatory Visit: Payer: Self-pay

## 2019-10-21 DIAGNOSIS — C61 Malignant neoplasm of prostate: Secondary | ICD-10-CM | POA: Insufficient documentation

## 2019-10-21 DIAGNOSIS — Z510 Encounter for antineoplastic radiation therapy: Secondary | ICD-10-CM | POA: Insufficient documentation

## 2019-10-22 ENCOUNTER — Other Ambulatory Visit: Payer: Self-pay

## 2019-10-22 ENCOUNTER — Ambulatory Visit
Admission: RE | Admit: 2019-10-22 | Discharge: 2019-10-22 | Disposition: A | Discharge status: Home / Self Care | Payer: Medicare Other | Source: Ambulatory Visit | Attending: Radiation Oncology | Admitting: Radiation Oncology

## 2019-10-22 DIAGNOSIS — Z510 Encounter for antineoplastic radiation therapy: Secondary | ICD-10-CM | POA: Diagnosis not present

## 2019-10-23 ENCOUNTER — Ambulatory Visit
Admission: RE | Admit: 2019-10-23 | Discharge: 2019-10-23 | Disposition: A | Discharge status: Home / Self Care | Payer: Medicare Other | Source: Ambulatory Visit | Attending: Radiation Oncology | Admitting: Radiation Oncology

## 2019-10-23 ENCOUNTER — Other Ambulatory Visit: Payer: Self-pay

## 2019-10-23 ENCOUNTER — Ambulatory Visit: Payer: Medicare Other

## 2019-10-23 DIAGNOSIS — Z510 Encounter for antineoplastic radiation therapy: Secondary | ICD-10-CM | POA: Diagnosis not present

## 2019-10-24 ENCOUNTER — Ambulatory Visit
Admission: RE | Admit: 2019-10-24 | Discharge: 2019-10-24 | Disposition: A | Discharge status: Home / Self Care | Payer: Medicare Other | Source: Ambulatory Visit | Attending: Radiation Oncology | Admitting: Radiation Oncology

## 2019-10-24 ENCOUNTER — Other Ambulatory Visit: Payer: Self-pay

## 2019-10-24 DIAGNOSIS — Z510 Encounter for antineoplastic radiation therapy: Secondary | ICD-10-CM | POA: Diagnosis not present

## 2019-10-27 ENCOUNTER — Other Ambulatory Visit: Payer: Self-pay | Admitting: *Deleted

## 2019-10-27 ENCOUNTER — Other Ambulatory Visit: Payer: Self-pay

## 2019-10-27 ENCOUNTER — Ambulatory Visit
Admission: RE | Admit: 2019-10-27 | Discharge: 2019-10-27 | Disposition: A | Discharge status: Home / Self Care | Payer: Medicare Other | Source: Ambulatory Visit | Attending: Radiation Oncology | Admitting: Radiation Oncology

## 2019-10-27 DIAGNOSIS — Z510 Encounter for antineoplastic radiation therapy: Secondary | ICD-10-CM | POA: Diagnosis not present

## 2019-10-27 MED ORDER — TAMSULOSIN HCL 0.4 MG PO CAPS: 0.4000 mg | ORAL_CAPSULE | Freq: Every day | ORAL | 6 refills | Status: DC

## 2019-10-28 ENCOUNTER — Ambulatory Visit
Admission: RE | Admit: 2019-10-28 | Discharge: 2019-10-28 | Disposition: A | Discharge status: Home / Self Care | Payer: Medicare Other | Source: Ambulatory Visit | Attending: Radiation Oncology | Admitting: Radiation Oncology

## 2019-10-28 ENCOUNTER — Other Ambulatory Visit: Payer: Self-pay

## 2019-10-28 ENCOUNTER — Other Ambulatory Visit: Payer: Self-pay | Admitting: *Deleted

## 2019-10-28 DIAGNOSIS — Z510 Encounter for antineoplastic radiation therapy: Secondary | ICD-10-CM | POA: Diagnosis not present

## 2019-10-29 ENCOUNTER — Ambulatory Visit
Admission: RE | Admit: 2019-10-29 | Discharge: 2019-10-29 | Disposition: A | Discharge status: Home / Self Care | Payer: Medicare Other | Source: Ambulatory Visit | Attending: Radiation Oncology | Admitting: Radiation Oncology

## 2019-10-29 ENCOUNTER — Other Ambulatory Visit: Payer: Self-pay

## 2019-10-29 DIAGNOSIS — Z510 Encounter for antineoplastic radiation therapy: Secondary | ICD-10-CM | POA: Diagnosis not present

## 2019-10-30 ENCOUNTER — Ambulatory Visit
Admission: RE | Admit: 2019-10-30 | Discharge: 2019-10-30 | Disposition: A | Discharge status: Home / Self Care | Payer: Medicare Other | Source: Ambulatory Visit | Attending: Radiation Oncology | Admitting: Radiation Oncology

## 2019-10-30 ENCOUNTER — Other Ambulatory Visit: Payer: Self-pay

## 2019-10-30 DIAGNOSIS — Z510 Encounter for antineoplastic radiation therapy: Secondary | ICD-10-CM | POA: Diagnosis not present

## 2019-10-31 ENCOUNTER — Other Ambulatory Visit: Payer: Self-pay

## 2019-10-31 ENCOUNTER — Ambulatory Visit
Admission: RE | Admit: 2019-10-31 | Discharge: 2019-10-31 | Disposition: A | Discharge status: Home / Self Care | Payer: Medicare Other | Source: Ambulatory Visit | Attending: Radiation Oncology | Admitting: Radiation Oncology

## 2019-10-31 DIAGNOSIS — Z510 Encounter for antineoplastic radiation therapy: Secondary | ICD-10-CM | POA: Diagnosis not present

## 2019-11-03 ENCOUNTER — Other Ambulatory Visit: Payer: Self-pay

## 2019-11-03 ENCOUNTER — Encounter (INDEPENDENT_AMBULATORY_CARE_PROVIDER_SITE_OTHER): Payer: Self-pay

## 2019-11-03 ENCOUNTER — Inpatient Hospital Stay: Payer: Medicare Other | Attending: Radiation Oncology

## 2019-11-03 ENCOUNTER — Ambulatory Visit
Admission: RE | Admit: 2019-11-03 | Discharge: 2019-11-03 | Disposition: A | Discharge status: Home / Self Care | Payer: Medicare Other | Source: Ambulatory Visit | Attending: Radiation Oncology | Admitting: Radiation Oncology

## 2019-11-03 DIAGNOSIS — C61 Malignant neoplasm of prostate: Secondary | ICD-10-CM | POA: Diagnosis present

## 2019-11-03 DIAGNOSIS — Z510 Encounter for antineoplastic radiation therapy: Secondary | ICD-10-CM | POA: Diagnosis not present

## 2019-11-03 LAB — CBC
HCT: 42.7 % (ref 39.0–52.0)
Hemoglobin: 13.3 g/dL (ref 13.0–17.0)
MCH: 27.9 pg (ref 26.0–34.0)
MCHC: 31.1 g/dL (ref 30.0–36.0)
MCV: 89.5 fL (ref 80.0–100.0)
Platelets: 164 K/uL (ref 150–400)
RBC: 4.77 MIL/uL (ref 4.22–5.81)
RDW: 12.1 % (ref 11.5–15.5)
WBC: 5.2 K/uL (ref 4.0–10.5)
nRBC: 0 % (ref 0.0–0.2)

## 2019-11-04 ENCOUNTER — Other Ambulatory Visit: Payer: Self-pay

## 2019-11-04 ENCOUNTER — Ambulatory Visit
Admission: RE | Admit: 2019-11-04 | Discharge: 2019-11-04 | Disposition: A | Discharge status: Home / Self Care | Payer: Medicare Other | Source: Ambulatory Visit | Attending: Radiation Oncology | Admitting: Radiation Oncology

## 2019-11-04 DIAGNOSIS — Z510 Encounter for antineoplastic radiation therapy: Secondary | ICD-10-CM | POA: Diagnosis not present

## 2019-11-05 ENCOUNTER — Ambulatory Visit
Admission: RE | Admit: 2019-11-05 | Discharge: 2019-11-05 | Disposition: A | Discharge status: Home / Self Care | Payer: Medicare Other | Source: Ambulatory Visit | Attending: Radiation Oncology | Admitting: Radiation Oncology

## 2019-11-05 ENCOUNTER — Other Ambulatory Visit: Payer: Self-pay

## 2019-11-05 DIAGNOSIS — Z510 Encounter for antineoplastic radiation therapy: Secondary | ICD-10-CM | POA: Diagnosis not present

## 2019-11-06 ENCOUNTER — Ambulatory Visit
Admission: RE | Admit: 2019-11-06 | Discharge: 2019-11-06 | Disposition: A | Discharge status: Home / Self Care | Payer: Medicare Other | Source: Ambulatory Visit | Attending: Radiation Oncology | Admitting: Radiation Oncology

## 2019-11-06 ENCOUNTER — Other Ambulatory Visit: Payer: Self-pay

## 2019-11-06 DIAGNOSIS — Z510 Encounter for antineoplastic radiation therapy: Secondary | ICD-10-CM | POA: Diagnosis not present

## 2019-11-07 ENCOUNTER — Other Ambulatory Visit: Payer: Self-pay

## 2019-11-07 ENCOUNTER — Ambulatory Visit
Admission: RE | Admit: 2019-11-07 | Discharge: 2019-11-07 | Disposition: A | Discharge status: Home / Self Care | Payer: Medicare Other | Source: Ambulatory Visit | Attending: Radiation Oncology | Admitting: Radiation Oncology

## 2019-11-07 DIAGNOSIS — Z510 Encounter for antineoplastic radiation therapy: Secondary | ICD-10-CM | POA: Diagnosis not present

## 2019-11-10 ENCOUNTER — Ambulatory Visit
Admission: RE | Admit: 2019-11-10 | Discharge: 2019-11-10 | Disposition: A | Discharge status: Home / Self Care | Payer: Medicare Other | Source: Ambulatory Visit | Attending: Radiation Oncology | Admitting: Radiation Oncology

## 2019-11-10 ENCOUNTER — Other Ambulatory Visit: Payer: Self-pay

## 2019-11-10 DIAGNOSIS — Z510 Encounter for antineoplastic radiation therapy: Secondary | ICD-10-CM | POA: Diagnosis not present

## 2019-11-11 ENCOUNTER — Other Ambulatory Visit: Payer: Self-pay

## 2019-11-11 ENCOUNTER — Ambulatory Visit
Admission: RE | Admit: 2019-11-11 | Discharge: 2019-11-11 | Disposition: A | Discharge status: Home / Self Care | Payer: Medicare Other | Source: Ambulatory Visit | Attending: Radiation Oncology | Admitting: Radiation Oncology

## 2019-11-11 DIAGNOSIS — Z510 Encounter for antineoplastic radiation therapy: Secondary | ICD-10-CM | POA: Diagnosis not present

## 2019-11-12 ENCOUNTER — Other Ambulatory Visit: Payer: Self-pay

## 2019-11-12 ENCOUNTER — Ambulatory Visit
Admission: RE | Admit: 2019-11-12 | Discharge: 2019-11-12 | Disposition: A | Discharge status: Home / Self Care | Payer: Medicare Other | Source: Ambulatory Visit | Attending: Radiation Oncology | Admitting: Radiation Oncology

## 2019-11-12 DIAGNOSIS — Z510 Encounter for antineoplastic radiation therapy: Secondary | ICD-10-CM | POA: Diagnosis not present

## 2019-11-13 ENCOUNTER — Ambulatory Visit
Admission: RE | Admit: 2019-11-13 | Discharge: 2019-11-13 | Disposition: A | Discharge status: Home / Self Care | Payer: Medicare Other | Source: Ambulatory Visit | Attending: Radiation Oncology | Admitting: Radiation Oncology

## 2019-11-13 ENCOUNTER — Other Ambulatory Visit: Payer: Self-pay

## 2019-11-13 DIAGNOSIS — Z510 Encounter for antineoplastic radiation therapy: Secondary | ICD-10-CM | POA: Diagnosis not present

## 2019-11-17 ENCOUNTER — Other Ambulatory Visit: Payer: Self-pay

## 2019-11-17 ENCOUNTER — Ambulatory Visit
Admission: RE | Admit: 2019-11-17 | Discharge: 2019-11-17 | Disposition: A | Discharge status: Home / Self Care | Payer: Medicare Other | Source: Ambulatory Visit | Attending: Radiation Oncology | Admitting: Radiation Oncology

## 2019-11-17 ENCOUNTER — Inpatient Hospital Stay: Payer: Medicare Other

## 2019-11-17 DIAGNOSIS — C61 Malignant neoplasm of prostate: Secondary | ICD-10-CM

## 2019-11-17 DIAGNOSIS — Z510 Encounter for antineoplastic radiation therapy: Secondary | ICD-10-CM | POA: Diagnosis not present

## 2019-11-17 LAB — CBC
HCT: 40.2 % (ref 39.0–52.0)
Hemoglobin: 12.3 g/dL — ABNORMAL LOW (ref 13.0–17.0)
MCH: 27.5 pg (ref 26.0–34.0)
MCHC: 30.6 g/dL (ref 30.0–36.0)
MCV: 89.7 fL (ref 80.0–100.0)
Platelets: 136 10*3/uL — ABNORMAL LOW (ref 150–400)
RBC: 4.48 MIL/uL (ref 4.22–5.81)
RDW: 12.4 % (ref 11.5–15.5)
WBC: 5 10*3/uL (ref 4.0–10.5)
nRBC: 0 % (ref 0.0–0.2)

## 2019-11-18 ENCOUNTER — Ambulatory Visit
Admission: RE | Admit: 2019-11-18 | Discharge: 2019-11-18 | Disposition: A | Discharge status: Home / Self Care | Payer: Medicare Other | Source: Ambulatory Visit | Attending: Radiation Oncology | Admitting: Radiation Oncology

## 2019-11-18 ENCOUNTER — Other Ambulatory Visit: Payer: Self-pay

## 2019-11-18 DIAGNOSIS — Z510 Encounter for antineoplastic radiation therapy: Secondary | ICD-10-CM | POA: Diagnosis not present

## 2019-11-19 ENCOUNTER — Other Ambulatory Visit: Payer: Self-pay

## 2019-11-19 ENCOUNTER — Ambulatory Visit
Admission: RE | Admit: 2019-11-19 | Discharge: 2019-11-19 | Disposition: A | Discharge status: Home / Self Care | Payer: Medicare Other | Source: Ambulatory Visit | Attending: Radiation Oncology | Admitting: Radiation Oncology

## 2019-11-19 DIAGNOSIS — Z510 Encounter for antineoplastic radiation therapy: Secondary | ICD-10-CM | POA: Diagnosis not present

## 2019-11-20 ENCOUNTER — Ambulatory Visit
Admission: RE | Admit: 2019-11-20 | Discharge: 2019-11-20 | Disposition: A | Discharge status: Home / Self Care | Payer: Medicare Other | Source: Ambulatory Visit | Attending: Radiation Oncology | Admitting: Radiation Oncology

## 2019-11-20 ENCOUNTER — Other Ambulatory Visit: Payer: Self-pay

## 2019-11-20 DIAGNOSIS — Z510 Encounter for antineoplastic radiation therapy: Secondary | ICD-10-CM | POA: Diagnosis not present

## 2019-11-24 ENCOUNTER — Ambulatory Visit
Admission: RE | Admit: 2019-11-24 | Discharge: 2019-11-24 | Disposition: A | Discharge status: Home / Self Care | Payer: Medicare Other | Source: Ambulatory Visit | Attending: Radiation Oncology | Admitting: Radiation Oncology

## 2019-11-24 ENCOUNTER — Other Ambulatory Visit: Payer: Self-pay

## 2019-11-24 DIAGNOSIS — C61 Malignant neoplasm of prostate: Secondary | ICD-10-CM | POA: Insufficient documentation

## 2019-11-24 DIAGNOSIS — Z510 Encounter for antineoplastic radiation therapy: Secondary | ICD-10-CM | POA: Diagnosis not present

## 2019-11-25 ENCOUNTER — Ambulatory Visit
Admission: RE | Admit: 2019-11-25 | Discharge: 2019-11-25 | Disposition: A | Discharge status: Home / Self Care | Payer: Medicare Other | Source: Ambulatory Visit | Attending: Radiation Oncology | Admitting: Radiation Oncology

## 2019-11-25 ENCOUNTER — Other Ambulatory Visit: Payer: Self-pay

## 2019-11-25 DIAGNOSIS — Z510 Encounter for antineoplastic radiation therapy: Secondary | ICD-10-CM | POA: Diagnosis not present

## 2019-12-01 ENCOUNTER — Other Ambulatory Visit
Admission: RE | Admit: 2019-12-01 | Discharge: 2019-12-01 | Disposition: A | Discharge status: Home / Self Care | Payer: Medicare Other | Source: Ambulatory Visit | Attending: Urology | Admitting: Urology

## 2019-12-01 ENCOUNTER — Other Ambulatory Visit: Payer: Self-pay

## 2019-12-01 DIAGNOSIS — Z01812 Encounter for preprocedural laboratory examination: Secondary | ICD-10-CM | POA: Insufficient documentation

## 2019-12-01 DIAGNOSIS — Z20822 Contact with and (suspected) exposure to covid-19: Secondary | ICD-10-CM | POA: Diagnosis not present

## 2019-12-01 LAB — SARS CORONAVIRUS 2 (TAT 6-24 HRS): SARS Coronavirus 2: NEGATIVE

## 2019-12-02 ENCOUNTER — Other Ambulatory Visit: Payer: Self-pay

## 2019-12-02 MED ORDER — CIPROFLOXACIN IN D5W 400 MG/200ML IV SOLN
400.0000 mg | INTRAVENOUS | Status: AC
  Administered 2019-12-22: 08:00:00 400 mg via INTRAVENOUS

## 2019-12-03 ENCOUNTER — Ambulatory Visit
Admission: RE | Admit: 2019-12-03 | Discharge: 2019-12-03 | Disposition: A | Discharge status: Home / Self Care | Payer: Medicare Other | Source: Ambulatory Visit | Attending: Radiation Oncology | Admitting: Radiation Oncology

## 2019-12-03 ENCOUNTER — Other Ambulatory Visit: Payer: Self-pay

## 2019-12-03 ENCOUNTER — Encounter: Payer: Self-pay | Admitting: Radiation Oncology

## 2019-12-03 ENCOUNTER — Ambulatory Visit: Admission: RE | Admit: 2019-12-03 | Payer: Medicare Other | Source: Home / Self Care

## 2019-12-03 VITALS — BP 144/83 | HR 61 | Temp 96.8°F | Resp 16 | Wt 242.8 lb

## 2019-12-03 DIAGNOSIS — C61 Malignant neoplasm of prostate: Secondary | ICD-10-CM

## 2019-12-03 SURGERY — ULTRASOUND, PROSTATE, FOR VOLUME DETERMINATION
Anesthesia: Choice

## 2019-12-03 NOTE — H&P (Signed)
NEW PATIENT EVALUATION  Name: Jack Welch  MRN: [id number]  Date:   12/03/2019     DOB: July 10, 1946   This 74 y.o. 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 Prostate Cancer    Post volume study    DIAGNOSIS: The encounter diagnosis was Prostate cancer ([place]).   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  He did well with external beam treatment with very little side effects or complaints.  He is seen today to have volume study anticipation of an I-125 interstitial implant for boost.  He continues to do well with minimal increased lower urinary tract symptoms or diarrhea.  PLANNED TREATMENT REGIMEN: I-125 interstitial implant for boost  PAST MEDICAL HISTORY:  has a past medical history of Asthma and Cancer ([place]).    PAST SURGICAL HISTORY:  Past Surgical History:  Procedure Laterality Date  . none      FAMILY HISTORY: family history is not on file.  SOCIAL HISTORY:  reports that he quit smoking about 7 years ago. His smoking use included cigarettes. He has a 36.00 pack-year smoking history. He has quit using smokeless tobacco. He reports current alcohol use. He reports that he does not use drugs.  ALLERGIES: Patient has no known allergies.  MEDICATIONS:  Current Outpatient Medications  Medication Sig Dispense Refill  . albuterol (VENTOLIN HFA) 108 (90 Base) MCG/ACT inhaler Inhale 2 puffs into the lungs every 4 (four) hours as needed for  wheezing or shortness of breath.     . fluticasone (FLONASE) 50 MCG/ACT nasal spray Place 2 sprays into both nostrils daily as needed (allergies.).     Marland Kitchen tamsulosin (FLOMAX) 0.4 MG CAPS capsule Take 1 capsule (0.4 mg total) by mouth daily after supper. 30 capsule 6   No current facility-administered medications for this encounter.   Facility-Administered Medications Ordered in Other Encounters  Medication Dose Route Frequency Provider Last Rate Last Admin  . ciprofloxacin (CIPRO) IVPB 400 mg  400 mg Intravenous 60 min Pre-Op [name], MD        ECOG PERFORMANCE STATUS:  0 - Asymptomatic  REVIEW OF SYSTEMS: Patient denies any weight loss, fatigue, weakness, fever, chills or night sweats. Patient denies any loss of vision, blurred vision. Patient denies any ringing  of the ears or hearing loss. No irregular heartbeat. Patient denies heart murmur or history of fainting. Patient denies any chest pain or pain radiating to her upper extremities. Patient denies any shortness of breath, difficulty breathing at night, cough or hemoptysis. Patient denies any swelling in the lower legs. Patient denies any nausea vomiting, vomiting of blood, or coffee ground material in the vomitus. Patient denies any stomach pain. Patient states has had normal bowel movements no significant constipation or diarrhea. Patient denies any dysuria, hematuria or significant nocturia. Patient denies any problems walking, swelling in the joints or loss of balance. Patient denies any skin changes, loss of hair or loss of weight. Patient denies any excessive worrying or anxiety or significant depression. Patient  denies any problems with insomnia. Patient denies excessive thirst, polyuria, polydipsia. Patient denies any swollen glands, patient denies easy bruising or easy bleeding. Patient denies any recent infections, allergies or URI. Patient "s visual fields have not changed significantly in recent time.   PHYSICAL EXAM: BP  (!) 144/83 (BP Location: Left Arm)   Pulse 61   Temp (!) 96.8 F (36 C) (Tympanic)   Resp 16   Wt 242 lb 12.8 oz (110.1 kg)   BMI 31.17 kg/m  Well-developed well-nourished patient in NAD. HEENT reveals PERLA, EOMI, discs not visualized.  Oral cavity is clear. No oral mucosal lesions are identified. Neck is clear without evidence of cervical or supraclavicular adenopathy. Lungs are clear to A&P. Cardiac examination is essentially unremarkable with regular rate and rhythm without murmur rub or thrill. Abdomen is benign with no organomegaly or masses noted. Motor sensory and DTR levels are equal and symmetric in the upper and lower extremities. Cranial nerves II through XII are grossly intact. Proprioception is intact. No peripheral adenopathy or edema is identified. No motor or sensory levels are noted. Crude visual fields are within normal range.  LABORATORY DATA: Pathology report reviewed    RADIOLOGY RESULTS: Volume study performed in anticipation of I-125 interstitial implant   IMPRESSION: Stage IIb adenocarcinoma the prostate in 74 year old male who is completed external beam radiation therapy now for I-125 interstitial implant.  PLAN: Volume study was performed successfully.  At this time patient will undergo I-125 interstitial implant for boost.  He will have completed androgen deprivation therapy as well as external beam treatment to his prostate and pelvic nodes as well as I-125 interstitial implant boost.  At this point patient is cleared to proceed with I-125 interstitial implant for boost.  Risks and benefits of treatment including radiation safety precautions were reviewed with the patient and consent was signed.  Patient comprehends her treatment plan well.  I would like to take this opportunity to thank you for allowing me to participate in the care of your patient.[name], MD

## 2019-12-03 NOTE — Progress Notes (Signed)
Radiation Oncology Follow up Note  Name: Jack Welch   Date:   12/03/2019 MRN:  [id number] DOB: 1946/06/15    This 74 y.o. male presents to the hospital today for volume study anticipation of an I-125 interstitial implant for boost patient with stage IIb adenocarcinoma the prostate  REFERRING PROVIDER: Crissie Figures, PA-C  HPI: Patient is a 74 year old male who has completed external beam radiation therapy to both his prostate and pelvic nodes as well as started androgen deprivation therapy for stage IIb Gleason 8 (5+3) adenocarcinoma presenting with a PSA of 22.5.  He tolerated his external beam treatment extremely well.  He is doing well seen today for volume study in anticipation of I-125 interstitial implant for boost..  COMPLICATIONS OF TREATMENT: none  FOLLOW UP COMPLIANCE: keeps appointments   PHYSICAL EXAM:  There were no vitals taken for this visit. Well-developed well-nourished patient in NAD. HEENT reveals PERLA, EOMI, discs not visualized.  Oral cavity is clear. No oral mucosal lesions are identified. Neck is clear without evidence of cervical or supraclavicular adenopathy. Lungs are clear to A&P. Cardiac examination is essentially unremarkable with regular rate and rhythm without murmur rub or thrill. Abdomen is benign with no organomegaly or masses noted. Motor sensory and DTR levels are equal and symmetric in the upper and lower extremities. Cranial nerves II through XII are grossly intact. Proprioception is intact. No peripheral adenopathy or edema is identified. No motor or sensory levels are noted. Crude visual fields are within normal range.  RADIOLOGY RESULTS: Ultrasound used for volume study  PLAN: Patient was taken to the cystoscopy suite in the OR. Patient was placed in the low lithotomy position. Foley catheter was placed. Trans-rectal ultrasound probe was inserted into the rectum and prostate seminal vesicles were visualized as well as bladder base. stepping  images were performed on a 5 mm increments. Images will be placed in BrachyVision treatment planning system to determine seed placement coordinates for eventual I-125 interstitial implant. Images will be reviewed with the physics and dosimetry staff for final quality approval. I personally was present for the volume study and assisted in delineation of contour volumes.  At the end of the procedure Foley catheter was removed, rectal ultrasound probe was removed. Patient tolerated his procedures extremely well with no side effects or complaints. Patient has given appointment for interstitial implant date. Consent was signed today as well as history and physical performed in preparation for his outpatient surgical implant.     [name], MD

## 2019-12-08 DIAGNOSIS — Z01818 Encounter for other preprocedural examination: Secondary | ICD-10-CM | POA: Diagnosis present

## 2019-12-12 ENCOUNTER — Other Ambulatory Visit: Payer: Self-pay

## 2019-12-12 ENCOUNTER — Encounter
Admission: RE | Admit: 2019-12-12 | Discharge: 2019-12-12 | Disposition: A | Discharge status: Home / Self Care | Payer: Medicare Other | Source: Ambulatory Visit | Attending: Urology | Admitting: Urology

## 2019-12-12 DIAGNOSIS — Z01818 Encounter for other preprocedural examination: Secondary | ICD-10-CM | POA: Insufficient documentation

## 2019-12-12 NOTE — Patient Instructions (Signed)
Your procedure is scheduled on: December 22, 2019 Monday Report to Day Surgery on the 2nd floor of the [facility]. To find out your arrival time, please call [phone] between 1PM - 3PM on: Friday December 19, 2019  REMEMBER: Instructions that are not followed completely may result in serious medical risk, up to and including death; or upon the discretion of your surgeon and anesthesiologist your surgery may need to be rescheduled.  Do not eat food after midnight the night before surgery.  No gum chewing, lozengers or hard candies.  You may however, drink CLEAR liquids up to 2 hours before you are scheduled to arrive for your surgery. Do not drink anything within 2 hours of the start of your surgery.  Clear liquids include: - water  - apple juice without pulp -clear gatorade - black coffee or tea (Do NOT add milk or creamers to the coffee or tea) Do NOT drink anything that is not on this list.  Type 1 and Type 2 diabetics should only drink water.   No Alcohol for 24 hours before or after surgery.  No Smoking including e-cigarettes for 24 hours prior to surgery.  No chewable tobacco products for at least 6 hours prior to surgery.  No nicotine patches on the day of surgery.  On the morning of surgery brush your teeth with toothpaste and water, you may rinse your mouth with mouthwash if you wish. Do not swallow any toothpaste or mouthwash.  Notify your doctor if there is any change in your medical condition (cold, fever, infection).  Do not wear jewelry, make-up, hairpins, clips or nail polish.  Do not wear lotions, powders, or perfumes.   Do not shave 48 hours prior to surgery.   Contacts and dentures may not be worn into surgery.  Do not bring valuables to the hospital, including drivers license, insurance or credit cards.  [facility] is not responsible for any belongings or valuables.   TAKE THESE MEDICATIONS THE MORNING OF SURGERY: none  Take a shower morning of  surgery.  Fleets enema  as directed.  Use inhalers on the day of surgery   Follow recommendations from Cardiologist, Pulmonologist or PCP regarding stopping Aspirin, Coumadin, Plavix, Eliquis, Pradaxa, or Pletal.  Stop Anti-inflammatories (NSAIDS) such as Advil, Aleve, Ibuprofen, Motrin, Naproxen, Naprosyn and Aspirin based products such as Excedrin, Goodys Powder, BC Powder. (May take Tylenol or Acetaminophen if needed.)  Stop ANY OVER THE COUNTER supplements until after surgery. (May continue Vitamin D, Vitamin B, and multivitamin.)  Wear comfortable clothing (specific to your surgery type) to the hospital.  Plan for stool softeners for home use.  If you are being discharged the day of surgery, you will not be allowed to drive home. You will need a responsible adult to drive you home and stay with you that night.   If you are taking public transportation, you will need to have a responsible adult with you. Please confirm with your physician that it is acceptable to use public transportation.   Please call [phone] if you have any questions about these instructions.

## 2019-12-18 ENCOUNTER — Encounter
Admission: RE | Admit: 2019-12-18 | Discharge: 2019-12-18 | Disposition: A | Discharge status: Home / Self Care | Payer: Medicare Other | Source: Ambulatory Visit | Attending: Urology | Admitting: Urology

## 2019-12-18 ENCOUNTER — Inpatient Hospital Stay: Admission: RE | Admit: 2019-12-18 | Payer: Medicare Other | Source: Ambulatory Visit

## 2019-12-18 ENCOUNTER — Other Ambulatory Visit: Payer: Self-pay

## 2019-12-18 DIAGNOSIS — Z20822 Contact with and (suspected) exposure to covid-19: Secondary | ICD-10-CM | POA: Insufficient documentation

## 2019-12-18 DIAGNOSIS — Z01818 Encounter for other preprocedural examination: Secondary | ICD-10-CM | POA: Insufficient documentation

## 2019-12-18 DIAGNOSIS — R9431 Abnormal electrocardiogram [ECG] [EKG]: Secondary | ICD-10-CM | POA: Insufficient documentation

## 2019-12-18 DIAGNOSIS — C61 Malignant neoplasm of prostate: Secondary | ICD-10-CM | POA: Insufficient documentation

## 2019-12-18 LAB — CBC
HCT: 39 % (ref 39.0–52.0)
Hemoglobin: 12.2 g/dL — ABNORMAL LOW (ref 13.0–17.0)
MCH: 28.2 pg (ref 26.0–34.0)
MCHC: 31.3 g/dL (ref 30.0–36.0)
MCV: 90.1 fL (ref 80.0–100.0)
Platelets: 193 10*3/uL (ref 150–400)
RBC: 4.33 MIL/uL (ref 4.22–5.81)
RDW: 13.2 % (ref 11.5–15.5)
WBC: 5.7 10*3/uL (ref 4.0–10.5)
nRBC: 0 % (ref 0.0–0.2)

## 2019-12-18 LAB — SARS CORONAVIRUS 2 (TAT 6-24 HRS): SARS Coronavirus 2: NEGATIVE

## 2019-12-18 NOTE — Pre-Procedure Instructions (Signed)
Clearance requested for abnormal EKG. Faxed to PCP (notified via phone) and Surgeon (notified via  secure chat).

## 2019-12-21 NOTE — Anesthesia Preprocedure Evaluation (Addendum)
Anesthesia Evaluation  Patient identified by MRN, date of birth, ID band Patient awake    Reviewed: Allergy & Precautions, NPO status , Patient's Chart, lab work & pertinent test results  History of Anesthesia Complications Negative for: history of anesthetic complications  Airway Mallampati: II       Dental  (+) Edentulous Upper, Edentulous Lower   Pulmonary asthma , neg sleep apnea, neg COPD, Not current smoker, former smoker,           Cardiovascular (-) hypertension(-) Past MI and (-) CHF (-) dysrhythmias (-) Valvular Problems/Murmurs     Neuro/Psych neg Seizures    GI/Hepatic Neg liver ROS, neg GERD  ,  Endo/Other  neg diabetes  Renal/GU negative Renal ROS     Musculoskeletal   Abdominal   Peds  Hematology   Anesthesia Other Findings   Reproductive/Obstetrics                            Anesthesia Physical Anesthesia Plan  ASA: II  Anesthesia Plan: General   Post-op Pain Management:    Induction: Intravenous  PONV Risk Score and Plan: 2 and Ondansetron, Treatment may vary due to age or medical condition and Dexamethasone  Airway Management Planned: Oral ETT  Additional Equipment:   Intra-op Plan:   Post-operative Plan:   Informed Consent: I have reviewed the patients History and Physical, chart, labs and discussed the procedure including the risks, benefits and alternatives for the proposed anesthesia with the patient or authorized representative who has indicated his/her understanding and acceptance.       Plan Discussed with:   Anesthesia Plan Comments:         Anesthesia Quick Evaluation

## 2019-12-22 ENCOUNTER — Ambulatory Visit: Payer: Medicare Other | Admitting: Anesthesiology

## 2019-12-22 ENCOUNTER — Ambulatory Visit
Admission: RE | Admit: 2019-12-22 | Discharge: 2019-12-22 | Disposition: A | Discharge status: Home / Self Care | Payer: Medicare Other | Source: Ambulatory Visit | Attending: Radiation Oncology | Admitting: Radiation Oncology

## 2019-12-22 ENCOUNTER — Other Ambulatory Visit: Payer: Self-pay

## 2019-12-22 ENCOUNTER — Encounter: Payer: Self-pay | Admitting: Urology

## 2019-12-22 ENCOUNTER — Ambulatory Visit
Admission: RE | Admit: 2019-12-22 | Discharge: 2019-12-22 | Disposition: A | Discharge status: Home / Self Care | Payer: Medicare Other | Attending: Urology | Admitting: Urology

## 2019-12-22 ENCOUNTER — Ambulatory Visit: Payer: Medicare Other

## 2019-12-22 ENCOUNTER — Encounter
Admission: RE | Disposition: A | Discharge status: Home / Self Care | Payer: Self-pay | Source: Home / Self Care | Attending: Urology

## 2019-12-22 DIAGNOSIS — Z79899 Other long term (current) drug therapy: Secondary | ICD-10-CM | POA: Insufficient documentation

## 2019-12-22 DIAGNOSIS — J45909 Unspecified asthma, uncomplicated: Secondary | ICD-10-CM | POA: Insufficient documentation

## 2019-12-22 DIAGNOSIS — C61 Malignant neoplasm of prostate: Secondary | ICD-10-CM | POA: Diagnosis not present

## 2019-12-22 DIAGNOSIS — Z87891 Personal history of nicotine dependence: Secondary | ICD-10-CM | POA: Diagnosis not present

## 2019-12-22 HISTORY — PX: RADIOACTIVE SEED IMPLANT: SHX5150

## 2019-12-22 HISTORY — PX: CYSTOSCOPY: SHX5120

## 2019-12-22 SURGERY — INSERTION, RADIATION SOURCE, PROSTATE
Anesthesia: General

## 2019-12-22 MED ORDER — DEXAMETHASONE SODIUM PHOSPHATE 10 MG/ML IJ SOLN
INTRAMUSCULAR | Status: AC
  Filled 2019-12-22: qty 1

## 2019-12-22 MED ORDER — FENTANYL CITRATE (PF) 100 MCG/2ML IJ SOLN
INTRAMUSCULAR | Status: DC | PRN
  Administered 2019-12-22 (×2): 50 ug via INTRAVENOUS

## 2019-12-22 MED ORDER — SUCCINYLCHOLINE CHLORIDE 20 MG/ML IJ SOLN
INTRAMUSCULAR | Status: AC
  Filled 2019-12-22: qty 1

## 2019-12-22 MED ORDER — CIPROFLOXACIN HCL 500 MG PO TABS: 500.0000 mg | ORAL_TABLET | Freq: Two times a day (BID) | ORAL | 0 refills | Status: AC

## 2019-12-22 MED ORDER — ROCURONIUM BROMIDE 50 MG/5ML IV SOLN
INTRAVENOUS | Status: AC
  Filled 2019-12-22: qty 1

## 2019-12-22 MED ORDER — FAMOTIDINE 20 MG PO TABS
ORAL_TABLET | ORAL | Status: AC
  Filled 2019-12-22: qty 1

## 2019-12-22 MED ORDER — FENTANYL CITRATE (PF) 100 MCG/2ML IJ SOLN: 25.0000 ug | INTRAMUSCULAR | Status: DC | PRN

## 2019-12-22 MED ORDER — PROPOFOL 10 MG/ML IV BOLUS
INTRAVENOUS | Status: DC | PRN
  Administered 2019-12-22: 150 mg via INTRAVENOUS

## 2019-12-22 MED ORDER — SODIUM CHLORIDE (PF) 0.9 % IJ SOLN
INTRAMUSCULAR | Status: AC
  Filled 2019-12-22: qty 10

## 2019-12-22 MED ORDER — BACITRACIN ZINC 500 UNIT/GM EX OINT
TOPICAL_OINTMENT | CUTANEOUS | Status: DC | PRN
  Administered 2019-12-22: 1 via TOPICAL

## 2019-12-22 MED ORDER — SUGAMMADEX SODIUM 200 MG/2ML IV SOLN
INTRAVENOUS | Status: DC | PRN
  Administered 2019-12-22: 200 mg via INTRAVENOUS

## 2019-12-22 MED ORDER — LACTATED RINGERS IV SOLN: INTRAVENOUS | Status: DC

## 2019-12-22 MED ORDER — ONDANSETRON HCL 4 MG/2ML IJ SOLN
INTRAMUSCULAR | Status: DC | PRN
  Administered 2019-12-22: 4 mg via INTRAVENOUS

## 2019-12-22 MED ORDER — SUCCINYLCHOLINE CHLORIDE 20 MG/ML IJ SOLN
INTRAMUSCULAR | Status: DC | PRN
  Administered 2019-12-22: 100 mg via INTRAVENOUS

## 2019-12-22 MED ORDER — IPRATROPIUM-ALBUTEROL 0.5-2.5 (3) MG/3ML IN SOLN
RESPIRATORY_TRACT | Status: AC
  Administered 2019-12-22: 09:00:00 3 mL via RESPIRATORY_TRACT
  Filled 2019-12-22: qty 3

## 2019-12-22 MED ORDER — HYDROCODONE-ACETAMINOPHEN 5-325 MG PO TABS: 1.0000 | ORAL_TABLET | ORAL | 0 refills | Status: AC | PRN

## 2019-12-22 MED ORDER — GLYCOPYRROLATE 0.2 MG/ML IJ SOLN
INTRAMUSCULAR | Status: DC | PRN
  Administered 2019-12-22: .2 mg via INTRAVENOUS

## 2019-12-22 MED ORDER — CIPROFLOXACIN IN D5W 400 MG/200ML IV SOLN
INTRAVENOUS | Status: AC
  Filled 2019-12-22: qty 200

## 2019-12-22 MED ORDER — IPRATROPIUM-ALBUTEROL 0.5-2.5 (3) MG/3ML IN SOLN: 3.0000 mL | Freq: Once | RESPIRATORY_TRACT | Status: AC

## 2019-12-22 MED ORDER — ONDANSETRON HCL 4 MG/2ML IJ SOLN
INTRAMUSCULAR | Status: AC
  Filled 2019-12-22: qty 2

## 2019-12-22 MED ORDER — PHENYLEPHRINE HCL (PRESSORS) 10 MG/ML IV SOLN
INTRAVENOUS | Status: DC | PRN
  Administered 2019-12-22 (×5): 100 ug via INTRAVENOUS

## 2019-12-22 MED ORDER — FENTANYL CITRATE (PF) 100 MCG/2ML IJ SOLN
INTRAMUSCULAR | Status: AC
  Filled 2019-12-22: qty 2

## 2019-12-22 MED ORDER — LIDOCAINE HCL (PF) 2 % IJ SOLN
INTRAMUSCULAR | Status: AC
  Filled 2019-12-22: qty 5

## 2019-12-22 MED ORDER — BACITRACIN ZINC 500 UNIT/GM EX OINT
TOPICAL_OINTMENT | CUTANEOUS | Status: AC
  Filled 2019-12-22: qty 28.35

## 2019-12-22 MED ORDER — LIDOCAINE HCL (CARDIAC) PF 100 MG/5ML IV SOSY
PREFILLED_SYRINGE | INTRAVENOUS | Status: DC | PRN
  Administered 2019-12-22: 50 mg via INTRAVENOUS

## 2019-12-22 MED ORDER — EPHEDRINE SULFATE 50 MG/ML IJ SOLN
INTRAMUSCULAR | Status: AC
  Filled 2019-12-22: qty 1

## 2019-12-22 MED ORDER — FLEET ENEMA 7-19 GM/118ML RE ENEM: 1.0000 | ENEMA | Freq: Once | RECTAL | Status: DC

## 2019-12-22 MED ORDER — DEXAMETHASONE SODIUM PHOSPHATE 10 MG/ML IJ SOLN
INTRAMUSCULAR | Status: DC | PRN
  Administered 2019-12-22: 5 mg via INTRAVENOUS

## 2019-12-22 MED ORDER — GLYCOPYRROLATE 0.2 MG/ML IJ SOLN
INTRAMUSCULAR | Status: AC
  Filled 2019-12-22: qty 1

## 2019-12-22 MED ORDER — EPHEDRINE SULFATE 50 MG/ML IJ SOLN
INTRAMUSCULAR | Status: DC | PRN
  Administered 2019-12-22: 10 mg via INTRAVENOUS

## 2019-12-22 MED ORDER — PROPOFOL 10 MG/ML IV BOLUS
INTRAVENOUS | Status: AC
  Filled 2019-12-22: qty 20

## 2019-12-22 MED ORDER — ONDANSETRON HCL 4 MG/2ML IJ SOLN: 4.0000 mg | Freq: Once | INTRAMUSCULAR | Status: DC | PRN

## 2019-12-22 MED ORDER — ROCURONIUM BROMIDE 100 MG/10ML IV SOLN
INTRAVENOUS | Status: DC | PRN
  Administered 2019-12-22: 35 mg via INTRAVENOUS
  Administered 2019-12-22: 5 mg via INTRAVENOUS

## 2019-12-22 MED ORDER — FAMOTIDINE 20 MG PO TABS
20.0000 mg | ORAL_TABLET | Freq: Once | ORAL | Status: AC
  Administered 2019-12-22: 06:00:00 20 mg via ORAL

## 2019-12-22 SURGICAL SUPPLY — 24 items
BAG URINE DRAIN 2000ML AR STRL (UROLOGICAL SUPPLIES) ×4 IMPLANT
BLADE CLIPPER SURG (BLADE) IMPLANT
BRUSH SCRUB EZ 1% IODOPHOR (MISCELLANEOUS) ×4 IMPLANT
CATH FOL 2WAY LX 16X5 (CATHETERS) ×4 IMPLANT
COVER BACK TABLE REUSABLE LG (DRAPES) ×4 IMPLANT
DRAPE INCISE 23X17 IOBAN STRL (DRAPES) ×2
DRAPE INCISE IOBAN 23X17 STRL (DRAPES) ×2 IMPLANT
DRAPE UNDER BUTTOCK W/FLU (DRAPES) ×4 IMPLANT
DRSG TELFA 3X8 NADH (GAUZE/BANDAGES/DRESSINGS) ×4 IMPLANT
GLOVE BIO SURGEON STRL SZ7.5 (GLOVE) ×12 IMPLANT
GLOVE BIOGEL PI IND STRL 7.5 (GLOVE) ×6 IMPLANT
GLOVE BIOGEL PI INDICATOR 7.5 (GLOVE) ×6
GOWN STRL REUS W/ TWL LRG LVL3 (GOWN DISPOSABLE) ×2 IMPLANT
GOWN STRL REUS W/ TWL XL LVL3 (GOWN DISPOSABLE) ×4 IMPLANT
GOWN STRL REUS W/TWL LRG LVL3 (GOWN DISPOSABLE) ×2
GOWN STRL REUS W/TWL XL LVL3 (GOWN DISPOSABLE) ×4
IV NS 1000ML (IV SOLUTION) ×2
IV NS 1000ML BAXH (IV SOLUTION) ×2 IMPLANT
KIT TURNOVER CYSTO (KITS) ×4 IMPLANT
PACK CYSTO AR (MISCELLANEOUS) ×4 IMPLANT
SET CYSTO W/LG BORE CLAMP LF (SET/KITS/TRAYS/PACK) ×4 IMPLANT
SURGILUBE 2OZ TUBE FLIPTOP (MISCELLANEOUS) ×4 IMPLANT
SYR 10ML LL (SYRINGE) ×4 IMPLANT
WATER STERILE IRR 1000ML POUR (IV SOLUTION) ×4 IMPLANT

## 2019-12-22 NOTE — Op Note (Signed)
Preoperative diagnosis: Adenocarcinoma of the prostate    Postoperative diagnosis: Same    Procedure: I-125 prostate seed implantation, cystoscopy   Surgeon: Nickolas Madrid, MD   Radiation Oncologist: [name], M.D.    Anesthesia: General   Drains: none   Complications: none   Indications: Prostate cancer   Procedure: The patient was brought to operating suite and placement table in the supine position. At this time, a universal timeout protocol was performed, all team members were identified, Venodyne boots are placed, and he was administered IV Ancef in the preoperative period. He was placed in lithotomy position and prepped and draped in usual fashion. The radiation oncology department placed a transrectal ultrasound probe anchoring stand/ grid and aligned with previous imaging from the volume study. Foley catheter was inserted without difficulty.  All needle passage was done with real-time transrectal ultrasound guidance in both the transverse and sagittal plains in order to achieve the desired preplanned position. A total of 20 needles were placed.  36 active seeds were implanted. The Foley catheter was removed and a rigid cystoscopy failed to show any seeds outside the prostate without evidence of trauma to the urethral, prostatic fossa, or bladder.  The bladder was drained.  A fluoroscopic image was then obtained showing excellent distrubution of the brachytherapy seeds.  Each seed was counted and counts were correct.     The patient was then repositioned in the supine position, reversed from anesthesia, and taken to the PACU in stable condition.  Nickolas Madrid, MD 12/22/2019

## 2019-12-22 NOTE — Transfer of Care (Signed)
Immediate Anesthesia Transfer of Care Note  Patient: Jack Welch  Procedure(s) Performed: RADIOACTIVE SEED IMPLANT/BRACHYTHERAPY IMPLANT (N/A ) CYSTOSCOPY  Patient Location: PACU  Anesthesia Type:General  Level of Consciousness: drowsy  Airway & Oxygen Therapy: Patient Spontanous Breathing and Patient connected to face mask oxygen  Post-op Assessment: Report given to RN and Post -op Vital signs reviewed and stable  Post vital signs: Reviewed and stable  Last Vitals:  Vitals Value Taken Time  BP 145/88 12/22/19 0838  Temp    Pulse 84 12/22/19 0841  Resp 15 12/22/19 0841  SpO2 100 % 12/22/19 0841  Vitals shown include unvalidated device data.  Last Pain:  Vitals:   12/22/19 0556  TempSrc: Temporal  PainSc: 0-No pain         Complications: No apparent anesthesia complications

## 2019-12-22 NOTE — Anesthesia Procedure Notes (Signed)
Procedure Name: Intubation Performed by: Josetta Wigal, CRNA Pre-anesthesia Checklist: Patient identified, Patient being monitored, Timeout performed, Emergency Drugs available and Suction available Patient Re-evaluated:Patient Re-evaluated prior to induction Oxygen Delivery Method: Circle system utilized Preoxygenation: Pre-oxygenation with 100% oxygen Induction Type: IV induction Ventilation: Mask ventilation without difficulty Laryngoscope Size: Miller and 2 Grade View: Grade I Tube type: Oral Tube size: 7.5 mm Number of attempts: 1 Airway Equipment and Method: Stylet Placement Confirmation: ETT inserted through vocal cords under direct vision,  positive ETCO2 and breath sounds checked- equal and bilateral Secured at: 23 cm Tube secured with: Tape Dental Injury: Teeth and Oropharynx as per pre-operative assessment        

## 2019-12-22 NOTE — Progress Notes (Signed)
Radiation Oncology Follow up Note  Name: Jack Welch   Date:   12/22/2019 MRN:  [id number] DOB: August 05, 1946    This 74 y.o. 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  He was taken to the OR today after volume study have been completed and received I-125 interstitial implant for boost..  COMPLICATIONS OF TREATMENT: none  FOLLOW UP COMPLIANCE: keeps appointments   PHYSICAL EXAM:  There were no vitals taken for this visit. Well-developed well-nourished patient in NAD. HEENT reveals PERLA, EOMI, discs not visualized.  Oral cavity is clear. No oral mucosal lesions are identified. Neck is clear without evidence of cervical or supraclavicular adenopathy. Lungs are clear to A&P. Cardiac examination is essentially unremarkable with regular rate and rhythm without murmur rub or thrill. Abdomen is benign with no organomegaly or masses noted. Motor sensory and DTR levels are equal and symmetric in the upper and lower extremities. Cranial nerves II through XII are grossly intact. Proprioception is intact. No peripheral adenopathy or edema is identified. No motor or sensory levels are noted. Crude visual fields are within normal range.  RADIOLOGY RESULTS: Ultrasound and plain films used in study.  PLAN: Patient was taken to the operating room and general anesthesia was administered. Legs were immobilized in stirrups and patient was positioned in the exact same proportions as original volume study. Patient was prepped and Foley  catheter was placed. Ultrasound guidance identified the prostate and recreated the original set up as per treatment planning volume study.  20 needles were placed under ultrasound guidance with PVCs delivered to the prostate volume. After completion of procedure cystoscopy was performed by urology and no evidence of seeds in the bladder were noted. Patient tolerated the procedure extremely well. Initial plain film as doublecheck identified 55 seeds in the prostate. Patient has followup appointment in one month for CT scan for quality assurance will be performed.    [name], MD

## 2019-12-22 NOTE — Discharge Instructions (Addendum)
Brachytherapy for Prostate Cancer Brachytherapy for prostate cancer is radiation treatment that is placed inside of the prostate (prostate gland). There are several types of brachytherapy:  Low-dose rate (LDR) therapy. This may involve temporary implants or permanent radioactive seed or pellet implants. The radiation does not travel far from the prostate, which means that healthy, noncancerous tissues around the prostate receive only a small dose of radiation. This helps to protect those tissues from injury. This type of treatment may be followed by a course of external beam radiation. ? Temporary low-dose implants are left in the prostate for 1-7 days. The implants are needles, applicators, or thin, plastic tubes (catheters) that contain radioactive material. You will need to stay in the hospital while the implant is in place. ? Permanent low-dose implants (seeds or pellets) are injected into the prostate, and they work for up to one year after they are inserted. They are left in place and are not removed.  High-dose rate (HDR) therapy. This is given through needles, applicators, or catheters that contain radioactive material. The tubes are removed after treatment, and no radiation is left in the prostate. This type of treatment may be followed by a course of external beam radiation. Tell a health care provider about:  Any allergies you have.  All medicines you are taking, including vitamins, herbs, eye drops, creams, and over-the-counter medicines.  Any problems you or family members have had with anesthetic medicines.  Any surgeries you have had.  Any blood disorders you have.  Any medical conditions you have. What are the risks? Generally, this is a safe procedure. However, problems may occur, including:  Inflammation of the rectum.  Problems getting or keeping an erection (erectile dysfunction).  Trouble urinating.  Diarrhea.  Bleeding.  Loss of bowel control. What  happens before the procedure? Staying hydrated Follow instructions from your health care provider about hydration, which may include:  Up to 2 hours before the procedure - you may continue to drink clear liquids, such as water, clear fruit juice, black coffee, and plain tea. Eating and drinking Follow instructions from your health care provider about eating and drinking, which may include:  8 hours before the procedure - stop eating heavy meals or foods such as meat, fried foods, or fatty foods.  6 hours before the procedure - stop eating light meals or foods, such as toast or cereal.  6 hours before the procedure - stop drinking milk or drinks that contain milk.  2 hours before the procedure - stop drinking clear liquids. Medicines  Ask your health care provider about: ? Changing or stopping your regular medicines. This is especially important if you are taking diabetes medicines or blood thinners. ? Taking medicines such as aspirin and ibuprofen. These medicines can thin your blood. Do not take these medicines before your procedure if your health care provider instructs you not to.  You may be given antibiotic medicine to help prevent infection. General instructions  Plan to have someone take you home from the hospital or clinic.  If you will be going home right after the procedure, plan to have someone with you for 24 hours.  You may have imaging tests done, including an ultrasound, CT scan, or MRI.  You may have blood tests done.  You may have a test to check the electrical signals in your heart (electrocardiogram).  You may need to take medicine to clean out your bowel (bowel prep). What happens during the procedure?  To lower  your risk of infection: ? Your health care team will wash or sanitize their hands. ? Your skin will be washed with soap. ? Hair may be removed from the surgical area.  An IV will be inserted into one of your veins.  You will be given one or more  of the following: ? A medicine to help you relax (sedative). ? A medicine to numb the area (local anesthetic). ? A medicine to make you fall asleep (general anesthetic).  You may have a thin, plastic tube (catheter) inserted to drain your bladder.  If you are receiving brachytherapy with implants: ? A needle, applicator, or catheter will be inserted into the prostate. It will be inserted through a body cavity, such as the rectum, or through the tissue between the testicles and the anus (perineum). ? An X-ray, ultrasound, MRI, or CT scan will be used to guide the catheter or applicator toward the prostate. ? Radioactive seeds, wires, or ribbons will be fed through the catheter or applicator. ? If the high-dose method is used:  The radioactive wires or ribbons will be left in for a few minutes and then removed.  Once the treatment is finished, the catheter or applicator will be removed. ? If the low-dose method is used, the implant will stay in place for 1-7 days.  You will remain in the hospital while the implant is in place.  Once the treatment is finished, the radioactive material and catheter will be removed.  If you are receiving permanent, low-dose brachytherapy: ? Small, radioactive seeds or pellets will be injected into your prostate. This may be done through a catheter, needle, or applicator. ? The catheter or applicator will be removed, leaving the seeds in the prostate. The procedure may vary among health care providers and hospitals. What happens after the procedure?  Your blood pressure, heart rate, breathing rate, and blood oxygen level will be monitored until the medicines you were given have worn off.  Do not drive for 24 hours if you were given a sedative. Summary  Brachytherapy for prostate cancer is radiation treatment placed inside of the prostate (prostate gland).  There are several types of brachytherapy for prostate cancer, including low-dose temporary  treatment, low-dose permanent treatment, and high-dose temporary treatment.  Temporary low-dose implants are left in the prostate for 1-7 days.  Permanent low-dose implants are injected into the prostate and left in place. They work for up to one year after they are inserted.  Permanent high-dose therapy is given through tubes that contain radioactive material. The tubes are removed after treatment, and no radiation is left in the prostate. This information is not intended to replace advice given to you by your health care provider. Make sure you discuss any questions you have with your health care provider. Document Revised: 10/19/2017 Document Reviewed: 11/15/2016 Elsevier Patient Education  2020 [place]   1) The drugs that you were given will stay in your system until tomorrow so for the next 24 hours you should not:  A) Drive an automobile B) Make any legal decisions C) Drink any alcoholic beverage   2) You may resume regular meals tomorrow.  Today it is better to start with liquids and gradually work up to solid foods.  You may eat anything you prefer, but it is better to start with liquids, then soup and crackers, and gradually work up to solid foods.   3) Please notify your doctor immediately if you have any unusual  bleeding, trouble breathing, redness and pain at the surgery site, drainage, fever, or pain not relieved by medication. 4)   5) Your post-operative visit with Dr.                                     is: Date:                        Time:    Please call to schedule your post-operative visit.  6) Additional Instructions:

## 2019-12-22 NOTE — H&P (Signed)
UROLOGY H&P UPDATE  Agree with prior H&P dated 12/03/2019 by Dr. Baruch Gouty.  74 year old male with high risk prostate cancer treated with external beam radiation, 2 years of ADT, and here for brachytherapy boost today.  Cardiac: RRR Lungs: CTA bilaterally  Laterality: N/A Procedure: Brachytherapy seed implant  Informed consent obtained, we specifically discussed the risks of bleeding, infection, post-operative pain, need for additional procedures, risk of urinary symptoms or urinary retention, possible need for temporary Foley catheter.  [name], MD 12/22/2019

## 2019-12-23 NOTE — Anesthesia Postprocedure Evaluation (Signed)
Anesthesia Post Note  Patient: Jack Welch  Procedure(s) Performed: RADIOACTIVE SEED IMPLANT/BRACHYTHERAPY IMPLANT (N/A ) CYSTOSCOPY  Patient location during evaluation: PACU Anesthesia Type: General Level of consciousness: awake and alert Pain management: pain level controlled Vital Signs Assessment: post-procedure vital signs reviewed and stable Respiratory status: spontaneous breathing and respiratory function stable Cardiovascular status: stable Anesthetic complications: no     Last Vitals:  Vitals:   12/22/19 1017 12/22/19 1145  BP: 123/78 133/65  Pulse: 73 77  Resp: 18 17  Temp: (!) 36.2 C 36.5 C  SpO2: 97% 97%    Last Pain:  Vitals:   12/23/19 0805  TempSrc:   PainSc: 0-No pain                 Santosh Petter K

## 2020-01-19 ENCOUNTER — Ambulatory Visit: Payer: Medicare Other

## 2020-01-19 ENCOUNTER — Ambulatory Visit: Payer: Medicare Other | Admitting: Radiation Oncology

## 2020-01-20 ENCOUNTER — Other Ambulatory Visit: Payer: Self-pay | Admitting: *Deleted

## 2020-01-20 ENCOUNTER — Ambulatory Visit
Admission: RE | Admit: 2020-01-20 | Discharge: 2020-01-20 | Disposition: A | Discharge status: Home / Self Care | Payer: Medicare Other | Source: Ambulatory Visit | Attending: Radiation Oncology | Admitting: Radiation Oncology

## 2020-01-20 ENCOUNTER — Encounter: Payer: Self-pay | Admitting: Radiation Oncology

## 2020-01-20 ENCOUNTER — Other Ambulatory Visit: Payer: Self-pay

## 2020-01-20 VITALS — BP 117/74 | HR 74 | Temp 97.1°F | Resp 18 | Wt 235.8 lb

## 2020-01-20 DIAGNOSIS — Z510 Encounter for antineoplastic radiation therapy: Secondary | ICD-10-CM | POA: Diagnosis not present

## 2020-01-20 DIAGNOSIS — C61 Malignant neoplasm of prostate: Secondary | ICD-10-CM

## 2020-01-20 DIAGNOSIS — R351 Nocturia: Secondary | ICD-10-CM | POA: Insufficient documentation

## 2020-01-20 DIAGNOSIS — Z923 Personal history of irradiation: Secondary | ICD-10-CM | POA: Diagnosis not present

## 2020-01-20 DIAGNOSIS — R197 Diarrhea, unspecified: Secondary | ICD-10-CM | POA: Diagnosis not present

## 2020-01-20 NOTE — Progress Notes (Signed)
Radiation Oncology Follow up Note  Name: Jack Welch   Date:   01/20/2020 MRN:  [id number] DOB: Oct 01, 1946    This 74 y.o. male presents to the clinic today for 1 month follow-up status post I-125 interstitial implant for boost in a patient previously treated with external beam radiation therapy to his prostate and pelvic nodes for stage IIb Gleason 8 (5+3) adenocarcinoma prostate presenting with a PSA of 22.5.Marland Kitchen  REFERRING PROVIDER: Crissie Figures, PA-C  HPI: Patient is a 74 year old male now at 1 month having completed I-125 interstitial implant for boost in a patient with Gleason 8 (5+3) adenocarcinoma the prostate presenting with a PSA of 22.5.  Patient is also currently on ADT therapy.  Seen today in routine follow-up he is doing well.  He specifically denies significant increase in lower urinary tract symptoms.  He does have some frequency and urgency nocturia x3-4.  Also occasional mild intermittent diarrhea.  COMPLICATIONS OF TREATMENT: none  FOLLOW UP COMPLIANCE: keeps appointments   PHYSICAL EXAM:  BP 117/74 (BP Location: Left Arm, Patient Position: Sitting)   Pulse 74   Temp (!) 97.1 F (36.2 C) (Tympanic)   Resp 18   Wt 235 lb 12.8 oz (107 kg)   BMI 29.47 kg/m  Well-developed well-nourished patient in NAD. HEENT reveals PERLA, EOMI, discs not visualized.  Oral cavity is clear. No oral mucosal lesions are identified. Neck is clear without evidence of cervical or supraclavicular adenopathy. Lungs are clear to A&P. Cardiac examination is essentially unremarkable with regular rate and rhythm without murmur rub or thrill. Abdomen is benign with no organomegaly or masses noted. Motor sensory and DTR levels are equal and symmetric in the upper and lower extremities. Cranial nerves II through XII are grossly intact. Proprioception is intact. No peripheral adenopathy or edema is identified. No motor or sensory levels are noted. Crude visual fields are within normal range.  RADIOLOGY  RESULTS: CT scan for quality assurance of his prostate for seed placement accuracy was reviewed and shows excellent placement  PLAN: Present time patient is doing well 1 month out from I-125 interstitial implant.  I am pleased with his overall progress.  I have explained to him he is again needs radiation safety precautions for another month.  I have also explained we will perform a PSA in about 3 months when I will see him back in follow-up.  Patient knows to call sooner with any concerns.  I would like to take this opportunity to thank you for allowing me to participate in the care of your patient.[name], MD

## 2020-01-21 DIAGNOSIS — C61 Malignant neoplasm of prostate: Secondary | ICD-10-CM | POA: Diagnosis not present

## 2020-01-25 ENCOUNTER — Ambulatory Visit: Payer: Medicare Other | Attending: Internal Medicine

## 2020-01-25 DIAGNOSIS — Z23 Encounter for immunization: Secondary | ICD-10-CM | POA: Insufficient documentation

## 2020-01-25 NOTE — Progress Notes (Signed)
   Covid-19 Vaccination Clinic  Name:  Jack Welch    MRN: [id number] DOB: [birth year]  01/25/2020  Jack Welch was observed post Covid-19 immunization for 15 minutes without incident. He was provided with Vaccine Information Sheet and instruction to access the V-Safe system.   Jack Welch was instructed to call 911 with any severe reactions post vaccine: Marland Kitchen Difficulty breathing  . Swelling of face and throat  . A fast heartbeat  . A bad rash all over body  . Dizziness and weakness   Immunizations Administered    Name Date Dose VIS Date Route   Pfizer COVID-19 Vaccine 01/25/2020  9:57 AM 0.3 mL 10/31/2019 Intramuscular   Manufacturer: [place]   Lot: [id number]   [place]: [id number]

## 2020-01-26 ENCOUNTER — Ambulatory Visit: Payer: Medicare Other | Admitting: Radiation Oncology

## 2020-01-26 ENCOUNTER — Ambulatory Visit: Payer: Medicare Other

## 2020-02-02 DIAGNOSIS — C61 Malignant neoplasm of prostate: Secondary | ICD-10-CM | POA: Diagnosis not present

## 2020-02-16 ENCOUNTER — Ambulatory Visit: Payer: Medicare Other | Attending: Internal Medicine

## 2020-02-16 DIAGNOSIS — Z23 Encounter for immunization: Secondary | ICD-10-CM

## 2020-02-16 NOTE — Progress Notes (Signed)
   Covid-19 Vaccination Clinic  Name:  BRYDAN DEVARY    MRN: [id number] DOB: [birth year]  02/16/2020  Mr. Adler was observed post Covid-19 immunization for 15 minutes without incident. He was provided with Vaccine Information Sheet and instruction to access the V-Safe system.   Mr. Comerford was instructed to call 911 with any severe reactions post vaccine: Marland Kitchen Difficulty breathing  . Swelling of face and throat  . A fast heartbeat  . A bad rash all over body  . Dizziness and weakness   Immunizations Administered    Name Date Dose VIS Date Route   Pfizer COVID-19 Vaccine 02/16/2020  9:17 AM 0.3 mL 10/31/2019 Intramuscular   Manufacturer: [place]   Lot: [id number]   [place]: [id number]

## 2020-03-31 ENCOUNTER — Encounter: Payer: Self-pay | Admitting: Urology

## 2020-03-31 ENCOUNTER — Other Ambulatory Visit: Payer: Self-pay

## 2020-03-31 ENCOUNTER — Ambulatory Visit (INDEPENDENT_AMBULATORY_CARE_PROVIDER_SITE_OTHER): Payer: Medicare Other | Admitting: Urology

## 2020-03-31 VITALS — BP 135/78 | HR 92 | Ht 75.0 in | Wt 242.0 lb

## 2020-03-31 DIAGNOSIS — N529 Male erectile dysfunction, unspecified: Secondary | ICD-10-CM

## 2020-03-31 DIAGNOSIS — K629 Disease of anus and rectum, unspecified: Secondary | ICD-10-CM

## 2020-03-31 DIAGNOSIS — K5900 Constipation, unspecified: Secondary | ICD-10-CM | POA: Diagnosis not present

## 2020-03-31 DIAGNOSIS — C61 Malignant neoplasm of prostate: Secondary | ICD-10-CM

## 2020-03-31 MED ORDER — LEUPROLIDE ACETATE (6 MONTH) 45 MG ~~LOC~~ KIT
45.0000 mg | PACK | Freq: Once | SUBCUTANEOUS | Status: AC
  Administered 2020-03-31: 45 mg via SUBCUTANEOUS

## 2020-03-31 NOTE — Patient Instructions (Addendum)
Constipation, Adult Constipation is when a person:  Poops (has a bowel movement) fewer times in a week than normal.  Has a hard time pooping.  Has poop that is dry, hard, or bigger than normal. Follow these instructions at home: Eating and drinking   Eat foods that have a lot of fiber, such as: ? Fresh fruits and vegetables. ? Whole grains. ? Beans.  Eat less of foods that are high in fat, low in fiber, or overly processed, such as: ? [place] fries. ? Hamburgers. ? Cookies. ? Candy. ? Soda.  Drink enough fluid to keep your pee (urine) clear or pale yellow. General instructions  Exercise regularly or as told by your doctor.  Go to the restroom when you feel like you need to poop. Do not hold it in.  Take over-the-counter and prescription medicines only as told by your doctor. These include any fiber supplements.  Do pelvic floor retraining exercises, such as: ? Doing deep breathing while relaxing your lower belly (abdomen). ? Relaxing your pelvic floor while pooping.  Watch your condition for any changes.  Keep all follow-up visits as told by your doctor. This is important. Contact a doctor if:  You have pain that gets worse.  You have a fever.  You have not pooped for 4 days.  You throw up (vomit).  You are not hungry.  You lose weight.  You are bleeding from the anus.  You have thin, pencil-like poop (stool). Get help right away if:  You have a fever, and your symptoms suddenly get worse.  You leak poop or have blood in your poop.  Your belly feels hard or bigger than normal (is bloated).  You have very bad belly pain.  You feel dizzy or you faint. This information is not intended to replace advice given to you by your health care provider. Make sure you discuss any questions you have with your health care provider. Document Revised: 10/19/2017 Document Reviewed: 04/26/2016 Elsevier Patient Education  [place]. Psyllium Chewable bars or  wafers What is this medicine? PSYLLIUM (SIL i yum) is a bulk-forming fiber laxative. This medicine is used to treat constipation. Increasing fiber in the diet may also help lower cholesterol and promote heart health for some people. This medicine may be used for other purposes; ask your health care provider or pharmacist if you have questions. COMMON BRAND NAME(S): Metamucil What should I tell my health care provider before I take this medicine? They need to know if you have any of these conditions:  blockage in your bowel  difficulty swallowing  inflammatory bowel disease  stomach or intestine problems  sudden change in bowel habits lasting more than 2 weeks  an unusual or allergic reaction to psyllium, other medicines, dyes, or preservatives  pregnant or trying or get pregnant  breast-feeding How should I use this medicine? Take this medicine by mouth with a full glass of water. Take this medicine by mouth. Chew it completely before swallowing. Follow the directions on the package labeling, or take as directed by your health care professional. Take your medicine at regular intervals. Do not take your medicine more often than directed. Talk to your pediatrician regarding the use of this medicine in children. While this drug may be prescribed for children as young as 39 years of age for selected conditions, precautions do apply. Overdosage: If you think you have taken too much of this medicine contact a poison control center or emergency room at once. NOTE: This  medicine is only for you. Do not share this medicine with others. What if I miss a dose? If you miss a dose, take it as soon as you can. If it is almost time for your next dose, take only that dose. Do not take double or extra doses. What may interact with this medicine? Interactions are not expected. Take this product at least 2 hours before or after other medicines. This list may not describe all possible interactions. Give  your health care provider a list of all the medicines, herbs, non-prescription drugs, or dietary supplements you use. Also tell them if you smoke, drink alcohol, or use illegal drugs. Some items may interact with your medicine. What should I watch for while using this medicine? Check with your doctor or health care professional if your symptoms do not start to get better or if they get worse. Stop using this medicine and contact your doctor or health care professional if you have rectal bleeding or if you have to treat your constipation for more than 1 week. These could be signs of a more serious condition. Drink several glasses of water a day while you are taking this medicine. This will help to relieve constipation and prevent dehydration. What side effects may I notice from receiving this medicine? Side effects that you should report to your doctor or health care professional as soon as possible:  allergic reactions like skin rash, itching or hives, swelling of the face, lips, or tongue  breathing problems  chest pain  nausea, vomiting  rectal bleeding  trouble swallowing Side effects that usually do not require medical attention (report to your doctor or health care professional if they continue or are bothersome):  bloating  gas  stomach cramps This list may not describe all possible side effects. Call your doctor for medical advice about side effects. You may report side effects to FDA at 1-800-FDA-1088. Where should I keep my medicine? Keep out of the reach of children. Store at room temperature between 15 and 30 degrees C (59 and 86 degrees F). Protect from moisture. Throw away any unused medicine after the expiration date. NOTE: This sheet is a summary. It may not cover all possible information. If you have questions about this medicine, talk to your doctor, pharmacist, or health care provider.  2020 Elsevier/Gold Standard (2018-04-02 14:56:45) Psyllium granules or powder  for solution What is this medicine? PSYLLIUM (SIL i yum) is a bulk-forming fiber laxative. This medicine is used to treat constipation. Increasing fiber in the diet may also help lower cholesterol and promote heart health for some people. This medicine may be used for other purposes; ask your health care provider or pharmacist if you have questions. COMMON BRAND NAME(S): Fiber Therapy, GenFiber, Geri-Mucil, Hydrocil, Konsyl, Metamucil, Metamucil MultiHealth, Mucilin, Natural Fiber Therapy, Reguloid What should I tell my health care provider before I take this medicine? They need to know if you have any of these conditions:  blockage in your bowel  difficulty swallowing  inflammatory bowel disease  phenylketonuria  stomach or intestine problems  sudden change in bowel habits lasting more than 2 weeks  an unusual or allergic reaction to psyllium, other medicines, dyes, or preservatives  pregnant or trying or get pregnant  breast-feeding How should I use this medicine? Mix this medicine into a full glass (240 mL) of water or other cool drink. Take this medicine by mouth. Follow the directions on the package labeling, or take as directed by your health care professional. Take  your medicine at regular intervals. Do not take your medicine more often than directed. Talk to your pediatrician regarding the use of this medicine in children. While this drug may be prescribed for children as young as 35 years old for selected conditions, precautions do apply. Overdosage: If you think you have taken too much of this medicine contact a poison control center or emergency room at once. NOTE: This medicine is only for you. Do not share this medicine with others. What if I miss a dose? If you miss a dose, take it as soon as you can. If it is almost time for your next dose, take only that dose. Do not take double or extra doses. What may interact with this medicine? Interactions are not expected. Take  this product at least 2 hours before or after other medicines. This list may not describe all possible interactions. Give your health care provider a list of all the medicines, herbs, non-prescription drugs, or dietary supplements you use. Also tell them if you smoke, drink alcohol, or use illegal drugs. Some items may interact with your medicine. What should I watch for while using this medicine? Check with your doctor or health care professional if your symptoms do not start to get better or if they get worse. Stop using this medicine and contact your doctor or health care professional if you have rectal bleeding or if you have to treat your constipation for more than 1 week. These could be signs of a more serious condition. Drink several glasses of water a day while you are taking this medicine. This will help to relieve constipation and prevent dehydration. What side effects may I notice from receiving this medicine? Side effects that you should report to your doctor or health care professional as soon as possible:  allergic reactions like skin rash, itching or hives, swelling of the face, lips, or tongue  breathing problems  chest pain  nausea, vomiting  rectal bleeding  trouble swallowing Side effects that usually do not require medical attention (report to your doctor or health care professional if they continue or are bothersome):  bloating  gas  stomach cramps This list may not describe all possible side effects. Call your doctor for medical advice about side effects. You may report side effects to FDA at 1-800-FDA-1088. Where should I keep my medicine? Keep out of the reach of children. Store at room temperature between 15 and 30 degrees C (59 and 86 degrees F). Protect from moisture. Throw away any unused medicine after the expiration date. NOTE: This sheet is a summary. It may not cover all possible information. If you have questions about this medicine, talk to your  doctor, pharmacist, or health care provider.  2020 Elsevier/Gold Standard (2018-04-02 15:41:08) Psyllium oral capsule What is this medicine? PSYLLIUM (SIL i yum) is a bulk-forming fiber laxative. This medicine is used to treat constipation. Increasing fiber in the diet may also help lower cholesterol and promote heart health for some people. This medicine may be used for other purposes; ask your health care provider or pharmacist if you have questions. COMMON BRAND NAME(S): GenFiber, Konsyl, Metamucil, Metamucil MultiHealth, Natural Fiber Laxative, Reguloid What should I tell my health care provider before I take this medicine? They need to know if you have any of these conditions:  blockage in your bowel  difficulty swallowing  inflammatory bowel disease  stomach or intestine problems  sudden change in bowel habits lasting more than 2 weeks  an unusual or allergic  reaction to psyllium, other medicines, dyes, or preservatives  pregnant or trying or get pregnant  breast-feeding How should I use this medicine? Take this medicine by mouth with a full glass of water. Follow the directions on the package labeling, or take as directed by your health care professional. Take your medicine at regular intervals. Do not take your medicine more often than directed. Talk to your pediatrician regarding the use of this medicine in children. While this drug may be prescribed for children as young as 12 years of age for selected conditions, precautions do apply. Overdosage: If you think you have taken too much of this medicine contact a poison control center or emergency room at once. NOTE: This medicine is only for you. Do not share this medicine with others. What if I miss a dose? If you miss a dose, take it as soon as you can. If it is almost time for your next dose, take only that dose. Do not take double or extra doses. What may interact with this medicine? Interactions are not expected. Take  this product at least 2 hours before or after other medicines. This list may not describe all possible interactions. Give your health care provider a list of all the medicines, herbs, non-prescription drugs, or dietary supplements you use. Also tell them if you smoke, drink alcohol, or use illegal drugs. Some items may interact with your medicine. What should I watch for while using this medicine? Check with your doctor or health care professional if your symptoms do not start to get better or if they get worse. Stop using this medicine and contact your doctor or health care professional if you have rectal bleeding or if you have to treat your constipation for more than 1 week. These could be signs of a more serious condition. Drink several glasses of water a day while you are taking this medicine. This will help to relieve constipation and prevent dehydration. What side effects may I notice from receiving this medicine? Side effects that you should report to your doctor or health care professional as soon as possible:  allergic reactions like skin rash, itching or hives, swelling of the face, lips, or tongue  breathing problems  chest pain  nausea, vomiting  rectal bleeding  trouble swallowing Side effects that usually do not require medical attention (report to your doctor or health care professional if they continue or are bothersome):  bloating  gas  stomach cramps This list may not describe all possible side effects. Call your doctor for medical advice about side effects. You may report side effects to FDA at 1-800-FDA-1088. Where should I keep my medicine? Keep out of the reach of children. Store at room temperature between 15 and 30 degrees C (59 and 86 degrees F). Protect from moisture. Throw away any unused medicine after the expiration date. NOTE: This sheet is a summary. It may not cover all possible information. If you have questions about this medicine, talk to your  doctor, pharmacist, or health care provider.  2020 Elsevier/Gold Standard (2018-04-02 15:56:42)  Bone Health Bones protect organs, store calcium, anchor muscles, and support the whole body. Keeping your bones strong is important, especially as you get older. You can take actions to help keep your bones strong and healthy. Why is keeping my bones healthy important?  Keeping your bones healthy is important because your body constantly replaces bone cells. Cells get old, and new cells take their place. As we age, we lose bone cells because  the body may not be able to make enough new cells to replace the old cells. The amount of bone cells and bone tissue you have is referred to as bone mass. The higher your bone mass, the stronger your bones. The aging process leads to an overall loss of bone mass in the body, which can increase the likelihood of:  Joint pain and stiffness.  Broken bones.  A condition in which the bones become weak and brittle (osteoporosis). A large decline in bone mass occurs in older adults. In women, it occurs about the time of menopause. What actions can I take to keep my bones healthy? Good health habits are important for maintaining healthy bones. This includes eating nutritious foods and exercising regularly. To have healthy bones, you need to get enough of the right minerals and vitamins. Most nutrition experts recommend getting these nutrients from the foods that you eat. In some cases, taking supplements may also be recommended. Doing certain types of exercise is also important for bone health. What are the nutritional recommendations for healthy bones?  Eating a well-balanced diet with plenty of calcium and vitamin D will help to protect your bones. Nutritional recommendations vary from person to person. Ask your health care provider what is healthy for you. Here are some general guidelines. Get enough calcium Calcium is the most important (essential) mineral for bone  health. Most people can get enough calcium from their diet, but supplements may be recommended for people who are at risk for osteoporosis. Good sources of calcium include:  Dairy products, such as low-fat or nonfat milk, cheese, and yogurt.  Dark green leafy vegetables, such as bok choy and broccoli.  Calcium-fortified foods, such as orange juice, cereal, bread, soy beverages, and tofu products.  Nuts, such as almonds. Follow these recommended amounts for daily calcium intake:  Children, age 72-3: 700 mg.  Children, age 53-8: 1,000 mg.  Children, age 729-13: 1,300 mg.  Teens, age 30-18: 1,300 mg.  Adults, age 37-50: 1,000 mg.  Adults, age 68-70: ? Men: 1,000 mg. ? Women: 1,200 mg.  Adults, age 85 or older: 1,200 mg.  Pregnant and breastfeeding females: ? Teens: 1,300 mg. ? Adults: 1,000 mg. Get enough vitamin D Vitamin D is the most essential vitamin for bone health. It helps the body absorb calcium. Sunlight stimulates the skin to make vitamin D, so be sure to get enough sunlight. If you live in a cold climate or you do not get outside often, your health care provider may recommend that you take vitamin D supplements. Good sources of vitamin D in your diet include:  Egg yolks.  Saltwater fish.  Milk and cereal fortified with vitamin D. Follow these recommended amounts for daily vitamin D intake:  Children and teens, age 72-18: 600 international units.  Adults, age 24 or younger: 400-800 international units.  Adults, age 59 or older: 800-1,000 international units. Get other important nutrients Other nutrients that are important for bone health include:  Phosphorus. This mineral is found in meat, poultry, dairy foods, nuts, and legumes. The recommended daily intake for adult men and adult women is 700 mg.  Magnesium. This mineral is found in seeds, nuts, dark green vegetables, and legumes. The recommended daily intake for adult men is 400-420 mg. For adult women, it is  310-320 mg.  Vitamin K. This vitamin is found in green leafy vegetables. The recommended daily intake is 120 mg for adult men and 90 mg for adult women. What type of physical  activity is best for building and maintaining healthy bones? Weight-bearing and strength-building activities are important for building and maintaining healthy bones. Weight-bearing activities cause muscles and bones to work against gravity. Strength-building activities increase the strength of the muscles that support bones. Weight-bearing and muscle-building activities include:  Walking and hiking.  Jogging and running.  Dancing.  Gym exercises.  Lifting weights.  Tennis and racquetball.  Climbing stairs.  Aerobics. Adults should get at least 30 minutes of moderate physical activity on most days. Children should get at least 60 minutes of moderate physical activity on most days. Ask your health care provider what type of exercise is best for you. How can I find out if my bone mass is low? Bone mass can be measured with an X-ray test called a bone mineral density (BMD) test. This test is recommended for all women who are age 53 or older. It may also be recommended for:  Men who are age 84 or older.  People who are at risk for osteoporosis because of: ? Having bones that break easily. ? Having a long-term disease that weakens bones, such as kidney disease or rheumatoid arthritis. ? Having menopause earlier than normal. ? Taking medicine that weakens bones, such as steroids, thyroid hormones, or hormone treatment for breast cancer or prostate cancer. ? Smoking. ? Drinking three or more alcoholic drinks a day. If you find that you have a low bone mass, you may be able to prevent osteoporosis or further bone loss by changing your diet and lifestyle. Where can I find more information? For more information, check out the following websites:  [place]: AviationTales.fr  [employer]: www.bones.SouthExposed.es  International Osteoporosis Foundation: Administrator.iofbonehealth.org Summary  The aging process leads to an overall loss of bone mass in the body, which can increase the likelihood of broken bones and osteoporosis.  Eating a well-balanced diet with plenty of calcium and vitamin D will help to protect your bones.  Weight-bearing and strength-building activities are also important for building and maintaining strong bones.  Bone mass can be measured with an X-ray test called a bone mineral density (BMD) test. This information is not intended to replace advice given to you by your health care provider. Make sure you discuss any questions you have with your health care provider. Document Revised: 12/03/2017 Document Reviewed: 12/03/2017 Elsevier Patient Education  [place]. Calcium; Vitamin D oral tablets What is this medicine? CALCIUM; VITAMIN D (KAL see um; VYE ta min D) is a vitamin supplement. It is used to prevent conditions of low calcium and vitamin D. This medicine may be used for other purposes; ask your health care provider or pharmacist if you have questions. COMMON BRAND NAME(S): Calcarb 600 with Vitamin D, Calcet Plus Vitamin D, Calcitrate + D, Calcium Citrate + D3 Maximum, Calcium Citrate Maximum with D, Caltrate, Caltrate 600+D, Citracal + D, Citracal MAXIMUM + D, Citracal Petites with Vitamin D, Citrus Calcium Plus D, OSCAL 500 + D, OSCAL Calcium + D3, OSCAL Extra D3, Osteo-Poretical, Oysco 500 + D, Oysco D, Oystercal-D Calcium What should I tell my health care provider before I take this medicine? They need to know if you have any of these conditions:  constipation  dehydration  heart disease  high level of calcium or vitamin D in the blood  high level of phosphate in the blood  kidney disease  kidney stones  liver disease  parathyroid disease  sarcoidosis  stomach ulcer or obstruction  an unusual  or allergic  reaction to calcium, vitamin D, tartrazine dye, other medicines, foods, dyes, or preservatives  pregnant or trying to get pregnant  breast-feeding How should I use this medicine? Take this medicine by mouth with a glass of water. Follow the directions on the label. Take with food or within 1 hour after a meal. Take your medicine at regular intervals. Do not take your medicine more often than directed. Talk to your pediatrician regarding the use of this medicine in children. While this medicine may be used in children for selected conditions, precautions do apply. Overdosage: If you think you have taken too much of this medicine contact a poison control center or emergency room at once. NOTE: This medicine is only for you. Do not share this medicine with others. What if I miss a dose? If you miss a dose, take it as soon as you can. If it is almost time for your next dose, take only that dose. Do not take double or extra doses. What may interact with this medicine? Do not take this medicine with any of the following medications:  ammonium chloride  methenamine This medicine may also interact with the following medications:  antibiotics like ciprofloxacin, gatifloxacin, tetracycline  captopril  delavirdine  diuretics  gabapentin  iron supplements  medicines for fungal infections like ketoconazole and itraconazole  medicines for seizures like ethotoin and phenytoin  mineral oil  mycophenolate  other vitamins with calcium, vitamin D, or minerals  quinidine  rosuvastatin  sucralfate  thyroid medicine This list may not describe all possible interactions. Give your health care provider a list of all the medicines, herbs, non-prescription drugs, or dietary supplements you use. Also tell them if you smoke, drink alcohol, or use illegal drugs. Some items may interact with your medicine. What should I watch for while using this medicine? Taking this medicine is not a substitute  for a well-balanced diet and exercise. Talk with your doctor or health care provider and follow a healthy lifestyle. Do not take this medicine with high-fiber foods, large amounts of alcohol, or drinks containing caffeine. Do not take this medicine within 2 hours of any other medicines. What side effects may I notice from receiving this medicine? Side effects that you should report to your doctor or health care professional as soon as possible:  allergic reactions like skin rash, itching or hives, swelling of the face, lips, or tongue  confusion  dry mouth  high blood pressure  increased hunger or thirst  increased urination  irregular heartbeat  metallic taste  muscle or bone pain  pain when urinating  seizure  unusually weak or tired  weight loss Side effects that usually do not require medical attention (report to your doctor or health care professional if they continue or are bothersome):  constipation  diarrhea  headache  loss of appetite  nausea, vomiting  stomach upset This list may not describe all possible side effects. Call your doctor for medical advice about side effects. You may report side effects to FDA at 1-800-FDA-1088. Where should I keep my medicine? Keep out of the reach of children. Store at room temperature between 15 and 30 degrees C (59 and 86 degrees F). Protect from light. Keep container tightly closed. Throw away any unused medicine after the expiration date. NOTE: This sheet is a summary. It may not cover all possible information. If you have questions about this medicine, talk to your doctor, pharmacist, or health care provider.  2020 Elsevier/Gold Standard (2008-02-19 17:56:23)

## 2020-03-31 NOTE — Progress Notes (Signed)
Eligard SubQ Injection   Due to Prostate Cancer patient is present today for a Eligard Injection.  Medication: Eligard 6 month Dose: 45 mg  Location: right  Lot: [id number] Exp: 11/2021  Patient tolerated well, no complications were noted  Performed by: Elberta Leatherwood, [place]  Per Dr. Diamantina Providence patient is to continue therapy for 2 years. Patient's next follow up was scheduled for 09/27/20. This appointment was scheduled using wheel and given to patient today along with reminder continue on Vitamin D 800-1000iu and Calium 1000-1200mg  daily while on Androgen Deprivation Therapy.  PA approval dates: expires on 09/28/2020

## 2020-03-31 NOTE — Progress Notes (Signed)
03/31/2020 1:28 PM   TYRONN ONNEN 1945-12-16 [id number]  Referring provider: Crissie Figures, PA-C [address] [place],  [place] [address]  Chief Complaint  Patient presents with  . Prostate Cancer    HPI: Jack Welch is a 74 year old male with high grade prostate cancer who presents today for his second ADT injection.  He was diagnosed with high grade prostate cancer after undergoing a prostate biopsy on 08/14/2019 with Dr. Diamantina Providence.  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 Pathology showed Gleason score 5+3 = 8 prostate cancer(grade group 4)which,along with his PSA greater than 20,puts him in the high risk category. Perineural invasion was present.  Staging imaging with bone scan and CT were both negative for any metastatic disease.    He will have two years of ADT (end date 09/2021) in addition to external beam treatment to his prostate and pelvic nodes plus I-125 interstitial implant for boost.    He is also followed by Dr. Donella Stade and had an appointment with him on 01/20/2020.  Dr. Donella Stade was pleased with his overall progress and reminded him to continue his radiation safety precautions for 1 more month and he is scheduled to see him back on 04/22/2020 with a PSA prior to that appointment.    Today, he states he is still experiencing urinary frequency.  He is also suffering with erectile dysfunction.  He states he has not been able to achieve an erection since his treatment for prostate cancer.  He is also having issues with bouts of constipation and has been taking stool softeners on an as-needed basis.  Patient denies any modifying or aggravating factors.  Patient denies any gross hematuria, dysuria or suprapubic/flank pain.  Patient denies any fevers, chills, nausea or vomiting.   He has not experienced any hot flashes since starting the ADT.  He has not been taking calcium or vitamin D for bone  density protection.  He has a new complaint of a lump he has felt in his rectal area.  He stated he noted it the other day.  It is not causing him pain and it has not changed in size or consistency from when it was first noticed.  PMH: Past Medical History:  Diagnosis Date  . Asthma   . Cancer [facility])     Surgical History: Past Surgical History:  Procedure Laterality Date  . CATARACT EXTRACTION Right 1974  . CYSTOSCOPY  12/22/2019   Procedure: CYSTOSCOPY;  Surgeon: [name], MD;  Location: [facility] ORS;  Service: Urology;;  . none    . RADIOACTIVE SEED IMPLANT N/A 12/22/2019   Procedure: RADIOACTIVE SEED IMPLANT/BRACHYTHERAPY IMPLANT;  Surgeon: [name], MD;  Location: [facility] ORS;  Service: Urology;  Laterality: N/A;    Home Medications:  Allergies as of 03/31/2020   No Known Allergies     Medication List       Accurate as of Mar 31, 2020  1:28 PM. If you have any questions, ask your nurse or doctor.        albuterol 108 (90 Base) MCG/ACT inhaler Commonly known as: VENTOLIN HFA Inhale 2 puffs into the lungs every 4 (four) hours as needed for wheezing or shortness of breath.   fluticasone 50 MCG/ACT nasal spray Commonly known as: FLONASE Place 2 sprays into both nostrils daily as needed (allergies.).   tamsulosin 0.4 MG Caps capsule Commonly known as: FLOMAX Take 1 capsule (  0.4 mg total) by mouth daily after supper.       Allergies: No Known Allergies  Family History: Family History  Problem Relation Age of Onset  . Prostate cancer Neg Hx   . Bladder Cancer Neg Hx   . Kidney cancer Neg Hx     Social History:  reports that he quit smoking about 7 years ago. His smoking use included cigarettes. He has a 36.00 pack-year smoking history. His smokeless tobacco use includes chew. He reports current alcohol use of about 1.0 - 2.0 standard drinks of alcohol per week. He reports that he does not use drugs.  ROS: For pertinent review of systems please refer to  history of present illness Physical Exam: BP 135/78   Pulse 92   Ht 6\' 3"  (1.905 m)   Wt 242 lb (109.8 kg)   BMI 30.25 kg/m   Constitutional:  Well nourished. Alert and oriented, No acute distress. HEENT: [place] AT, mask in place.  Trachea midline. Cardiovascular: No clubbing, cyanosis, or edema. Respiratory: Normal respiratory effort, no increased work of breathing. Rectal: Patient with  normal sphincter tone. Anus and perineum without scarring or rash.  A grape sized cystic structure is located at the 3 o'clock position in the anus.  It is non tender, no erythema is noted, no crepitus or fluctuance is noted as well. Neurologic: Grossly intact, no focal deficits, moving all 4 extremities. Psychiatric: Normal mood and affect.  Laboratory Data: Component     Latest Ref Rng & Units 08/06/2019  Prostate Specific Ag, Serum     0.0 - 4.0 ng/mL 22.5 (H)  PSA, Free     N/A ng/mL 3.17  PSA, Free Pct     % 14.1    Lab Results  Component Value Date   WBC 5.7 12/18/2019   HGB 12.2 (L) 12/18/2019   HCT 39.0 12/18/2019   MCV 90.1 12/18/2019   PLT 193 12/18/2019    Lab Results  Component Value Date   CREATININE 1.60 (H) 09/23/2019   I have reviewed the labs.   Assessment & Plan:    1. Prostate cancer [facility]) Reviewed bone health and I have advised the patient to start Calcium 600 gm twice daily and Vitamin D 200 mg twice daily. Received 36-month Eligard injection today Return to clinic in 6 months for his next ADT injection (end date 09/2021)  2. ED Patient will be off radiation restrictions soon and we can address his erectile dysfunction at that time  3. Constipation Patient is taking his stool softener on a as needed basis as he does not want to be away from home if he should have to suddenly have a bowel movement I suggested that he get on a regimen of taking a daily fiber as it will likely assist him in having more regular bowel movements, so that he can be on a more scheduled  bowel regimen If it continues to be an issue, he will need to follow-up with his primary care provider  4. Anal lesion Given patient's issues with constipation I was expecting to find an external hemorrhoid, but the lesion appeared to be more cystic in nature perhaps representing an occluded sebaceous cyst I referred him on to general surgery for further evaluation  Return in about 6 months (around 10/01/2020) for ADT injection .  These notes generated with voice recognition software. I apologize for typographical errors.  Zara Council, [facility] [address]  [place], [place]  [address] [phone]  

## 2020-04-05 ENCOUNTER — Telehealth: Payer: Self-pay

## 2020-04-05 NOTE — Telephone Encounter (Signed)
Authorization form for Lupron/Eligard faxed.

## 2020-04-06 ENCOUNTER — Encounter: Payer: Self-pay | Admitting: General Surgery

## 2020-04-06 ENCOUNTER — Ambulatory Visit (INDEPENDENT_AMBULATORY_CARE_PROVIDER_SITE_OTHER): Payer: Medicare Other | Admitting: General Surgery

## 2020-04-06 ENCOUNTER — Other Ambulatory Visit: Payer: Self-pay

## 2020-04-06 VITALS — BP 136/83 | HR 90 | Temp 97.6°F | Resp 12 | Ht 74.0 in | Wt 243.2 lb

## 2020-04-06 DIAGNOSIS — K6289 Other specified diseases of anus and rectum: Secondary | ICD-10-CM | POA: Diagnosis not present

## 2020-04-06 NOTE — Progress Notes (Signed)
Patient ID: Jack Welch, male   DOB: 04/10/1946, 74 y.o.   MRN: [id number]  Chief Complaint  Patient presents with  . New Patient (Initial Visit)    anal lesion    HPI Jack Welch is a 74 y.o. male.   He was referred by Zara Council, the PA in the urology clinic for evaluation of a perianal mass found at his last follow-up with her.  He has a history of prostate cancer, status post radioactive seed implant/brachytherapy.  He states that he noticed the mass about a week ago when he had a bowel movement.  He says he felt like something was there, but it was not painful.  There was no blood.  No drainage from the site.  No fevers or chills, no nausea or vomiting.  Ms. Rulon Sera note comments on a soft, grape-sized lesion just at the anal verge.  He does have a history of constipation and has been recommended to use fiber in the past.  He does use MiraLAX on occasion.  I do not see that he has ever had a colonoscopy.   Past Medical History:  Diagnosis Date  . Asthma   . Cancer [facility])    Prostate    Past Surgical History:  Procedure Laterality Date  . CATARACT EXTRACTION Right 1974  . CYSTOSCOPY  12/22/2019   Procedure: CYSTOSCOPY;  Surgeon: [name], MD;  Location: [facility] ORS;  Service: Urology;;  . none    . RADIOACTIVE SEED IMPLANT N/A 12/22/2019   Procedure: RADIOACTIVE SEED IMPLANT/BRACHYTHERAPY IMPLANT;  Surgeon: [name], MD;  Location: [facility] ORS;  Service: Urology;  Laterality: N/A;    Family History  Problem Relation Age of Onset  . Prostate cancer Neg Hx   . Bladder Cancer Neg Hx   . Kidney cancer Neg Hx     Social History Social History   Tobacco Use  . Smoking status: Former Smoker    Packs/day: 0.75    Years: 48.00    Pack years: 36.00    Types: Cigarettes    Quit date: 2014    Years since quitting: 7.3  . Smokeless tobacco: Current User    Types: Chew  Substance Use Topics  . Alcohol use: Yes    Alcohol/week: 1.0 - 2.0 standard drinks    Types: 1 - 2 Shots of liquor per week  . Drug use: Never    No Known Allergies  Current Outpatient Medications  Medication Sig Dispense Refill  . albuterol (VENTOLIN HFA) 108 (90 Base) MCG/ACT inhaler Inhale 2 puffs into the lungs every 4 (four) hours as needed for wheezing or shortness of breath.     . fluticasone (FLONASE) 50 MCG/ACT nasal spray Place 2 sprays into both nostrils daily as needed (allergies.).     Marland Kitchen tamsulosin (FLOMAX) 0.4 MG CAPS capsule Take 1 capsule (0.4 mg total) by mouth daily after supper. 30 capsule 6   No current facility-administered medications for this visit.    Review of Systems Review of Systems  All other systems reviewed and are negative.   Blood pressure 136/83, pulse 90, temperature 97.6 F (36.4 C), resp. rate 12, height 6\' 2"  (1.88 m), weight 243 lb 3.2 oz (110.3 kg), SpO2 95 %. Body mass index is 31.23 kg/m.  Physical Exam Physical Exam Vitals reviewed. Exam conducted with a chaperone present.  Constitutional:      General: He is not in acute distress.    Appearance: Normal appearance. He is obese.  HENT:     Head: Normocephalic and atraumatic.     Nose:     Comments: Covered with a mask secondary to COVID-19 precautions    Mouth/Throat:     Comments: Covered with a mask secondary to COVID-19 precautions Eyes:     General: No scleral icterus.       Left eye: No discharge.     Comments: Wearing glasses.  The right eye has severe ptosis, to the point that it barely opens at rest.  The patient states that he suffered an injury and has no useful vision in that eye.  Neck:     Comments: No palpable cervical or supraclavicular lymphadenopathy.  No dominant thyroid masses or palpable thyromegaly. Cardiovascular:     Rate and Rhythm: Normal rate and regular rhythm.  Pulmonary:     Effort: Pulmonary effort is normal.     Breath sounds: Normal breath sounds.  Abdominal:     General: Bowel sounds are normal.     Palpations: Abdomen is  soft.  Genitourinary:      Comments: There is a soft, well-circumscribed, mobile lesion, roughly the size of a Thompson grape, just at the anal verge.  It is nontender and nonfluctuant.  There does not appear to be any drainage, nor I am I able to express anything with firm palpation. Musculoskeletal:     Right lower leg: No edema.     Left lower leg: No edema.  Skin:    General: Skin is warm and dry.  Neurological:     General: No focal deficit present.     Mental Status: He is alert and oriented to person, place, and time.  Psychiatric:        Mood and Affect: Mood normal.        Behavior: Behavior normal.     Data Reviewed I reviewed Ms. McGowan's note from Mar 31, 2020.  Her findings on physical exam are the same as mine.  Assessment This is a 74 year old man who has a small mass at the anal verge.  I do not think this represents a hemorrhoid, as there is no tenderness or bleeding associated with it.  It is soft and mobile and therefore unlikely to represent any sort of malignancy.  It is nontender and therefore unlikely to be an abscess.  It is not bothering him.  Plan I offered Mr. Kasdorf the option of surgical intervention, but indicated that based on my physical exam findings today, I did not think the lesion represented anything worrisome.  He indicated that he mostly was looking for some reassurance, as it is not bothering him.  He will continue to monitor the area at home and if he has any concerns or decides he would prefer to have it excised, he will contact her office.    Jack Welch 04/06/2020, 12:11 PM

## 2020-04-06 NOTE — Patient Instructions (Signed)
If this starts getting more painful, pus or blood in the toilet or want to get this remove please contact the office to schedule an appointment to get this removed.   Call the office if you have any questions or concerns.   Folliculitis  Folliculitis is inflammation of the hair follicles. Folliculitis most commonly occurs on the scalp, thighs, legs, back, and buttocks. However, it can occur anywhere on the body. What are the causes? This condition may be caused by:  A bacterial infection (common).  A fungal infection.  A viral infection.  Contact with certain chemicals, especially oils and tars.  Shaving or waxing.  Greasy ointments or creams applied to the skin. Long-lasting folliculitis and folliculitis that keeps coming back may be caused by bacteria. This bacteria can live anywhere on your skin and is often found in the nostrils. What increases the risk? You are more likely to develop this condition if you have:  A weakened immune system.  Diabetes.  Obesity. What are the signs or symptoms? Symptoms of this condition include:  Redness.  Soreness.  Swelling.  Itching.  Small white or yellow, pus-filled, itchy spots (pustules) that appear over a reddened area. If there is an infection that goes deep into the follicle, these may develop into a boil (furuncle).  A group of closely packed boils (carbuncle). These tend to form in hairy, sweaty areas of the body. How is this diagnosed? This condition is diagnosed with a skin exam. To find what is causing the condition, your health care provider may take a sample of one of the pustules or boils for testing in a lab. How is this treated? This condition may be treated by:  Applying warm compresses to the affected areas.  Taking an antibiotic medicine or applying an antibiotic medicine to the skin.  Applying or bathing with an antiseptic solution.  Taking an over-the-counter medicine to help with itching.  Having a  procedure to drain any pustules or boils. This may be done if a pustule or boil contains a lot of pus or fluid.  Having laser hair removal. This may be done to treat long-lasting folliculitis. Follow these instructions at home: Managing pain and swelling   If directed, apply heat to the affected area as often as told by your health care provider. Use the heat source that your health care provider recommends, such as a moist heat pack or a heating pad. ? Place a towel between your skin and the heat source. ? Leave the heat on for 20-30 minutes. ? Remove the heat if your skin turns bright red. This is especially important if you are unable to feel pain, heat, or cold. You may have a greater risk of getting burned. General instructions  If you were prescribed an antibiotic medicine, take it or apply it as told by your health care provider. Do not stop using the antibiotic even if your condition improves.  Check the irritated area every day for signs of infection. Check for: ? Redness, swelling, or pain. ? Fluid or blood. ? Warmth. ? Pus or a bad smell.  Do not shave irritated skin.  Take over-the-counter and prescription medicines only as told by your health care provider.  Keep all follow-up visits as told by your health care provider. This is important. Get help right away if:  You have more redness, swelling, or pain in the affected area.  Red streaks are spreading from the affected area.  You have a fever. Summary  Folliculitis  is inflammation of the hair follicles. Folliculitis most commonly occurs on the scalp, thighs, legs, back, and buttocks.  This condition may be treated by taking an antibiotic medicine or applying an antibiotic medicine to the skin, and applying or bathing with an antiseptic solution.  If you were prescribed an antibiotic medicine, take it or apply it as told by your health care provider. Do not stop using the antibiotic even if your condition  improves.  Get help right away if you have new or worsening symptoms.  Keep all follow-up visits as told by your health care provider. This is important. This information is not intended to replace advice given to you by your health care provider. Make sure you discuss any questions you have with your health care provider. Document Revised: 06/15/2018 Document Reviewed: 06/15/2018 Elsevier Patient Education  [place].

## 2020-04-14 ENCOUNTER — Other Ambulatory Visit: Payer: Medicare Other

## 2020-04-15 ENCOUNTER — Inpatient Hospital Stay: Payer: Medicare Other | Attending: Radiation Oncology

## 2020-04-15 ENCOUNTER — Other Ambulatory Visit: Payer: Self-pay

## 2020-04-15 DIAGNOSIS — C61 Malignant neoplasm of prostate: Secondary | ICD-10-CM | POA: Diagnosis not present

## 2020-04-15 LAB — PSA: Prostatic Specific Antigen: 0.06 ng/mL (ref 0.00–4.00)

## 2020-04-22 ENCOUNTER — Ambulatory Visit
Admission: RE | Admit: 2020-04-22 | Discharge: 2020-04-22 | Disposition: A | Discharge status: Home / Self Care | Payer: Medicare Other | Source: Ambulatory Visit | Attending: Radiation Oncology | Admitting: Radiation Oncology

## 2020-04-22 ENCOUNTER — Encounter: Payer: Self-pay | Admitting: Radiation Oncology

## 2020-04-22 ENCOUNTER — Other Ambulatory Visit: Payer: Self-pay | Admitting: *Deleted

## 2020-04-22 ENCOUNTER — Other Ambulatory Visit: Payer: Self-pay

## 2020-04-22 VITALS — BP 114/70 | HR 76 | Temp 97.0°F | Resp 16 | Wt 242.0 lb

## 2020-04-22 DIAGNOSIS — Z923 Personal history of irradiation: Secondary | ICD-10-CM | POA: Diagnosis not present

## 2020-04-22 DIAGNOSIS — C775 Secondary and unspecified malignant neoplasm of intrapelvic lymph nodes: Secondary | ICD-10-CM | POA: Diagnosis not present

## 2020-04-22 DIAGNOSIS — C61 Malignant neoplasm of prostate: Secondary | ICD-10-CM | POA: Diagnosis present

## 2020-04-22 MED ORDER — SILDENAFIL CITRATE 25 MG PO TABS: 25.0000 mg | ORAL_TABLET | Freq: Every day | ORAL | 6 refills | Status: DC | PRN

## 2020-04-22 NOTE — Progress Notes (Signed)
Radiation Oncology Follow up Note  Name: Jack Welch   Date:   04/22/2020 MRN:  [id number] DOB: 1946-07-09    This 74 y.o. male presents to the clinic today for 99-month follow-up status post I-125 interstitial implant for boost in patient previously treated with external beam radiation therapy to his prostate and pelvic nodes for stage IIb Gleason 8 (5+3) adenocarcinoma presenting with a PSA of 22.5.Marland Kitchen  REFERRING PROVIDER: Crissie Figures, PA-C  HPI: Patient is a 74 year old male now at 4 months having completed I-125 interstitial implant for boost as well as radiation therapy to his prostate and pelvic nodes for locally advanced stage IIb adenocarcinoma Gleason 8.  Seen today in routine follow-up he is doing well specifically denies diarrhea dysuria or any other GI/GU complaints..  Patient is currently on androgen deprivation therapy through urology.  His most recent PSA is 0000000  COMPLICATIONS OF TREATMENT: none  FOLLOW UP COMPLIANCE: keeps appointments   PHYSICAL EXAM:  BP 114/70 (BP Location: Left Arm, Patient Position: Sitting, Cuff Size: Large)   Pulse 76   Temp (!) 97 F (36.1 C) (Tympanic)   Resp 16   Wt 242 lb (109.8 kg)   BMI 31.07 kg/m  Well-developed well-nourished patient in NAD. HEENT reveals PERLA, EOMI, discs not visualized.  Oral cavity is clear. No oral mucosal lesions are identified. Neck is clear without evidence of cervical or supraclavicular adenopathy. Lungs are clear to A&P. Cardiac examination is essentially unremarkable with regular rate and rhythm without murmur rub or thrill. Abdomen is benign with no organomegaly or masses noted. Motor sensory and DTR levels are equal and symmetric in the upper and lower extremities. Cranial nerves II through XII are grossly intact. Proprioception is intact. No peripheral adenopathy or edema is identified. No motor or sensory levels are noted. Crude visual fields are within normal range.  RADIOLOGY RESULTS: No current films  to review  PLAN: Present time patient is under excellent biochemical control of his prostate cancer and still remains on androgen deprivation therapy which will continue.  I am pleased with his overall progress and low side effect profile.  He continues on androgen deprivation therapy.  Of asked to see him back in 6 months with a PSA at that time.  Patient knows to call with any concerns.  I would like to take this opportunity to thank you for allowing me to participate in the care of your patient.[name], MD

## 2020-06-18 ENCOUNTER — Other Ambulatory Visit: Payer: Self-pay | Admitting: Radiation Oncology

## 2020-06-29 NOTE — Telephone Encounter (Signed)
Per [facility] pt's insurance is currently inactive. Insurance termed on 11/20/2019. Ref #[id number]. Will mail letter asking for updated insurance card to be brought in as appt is not until November. If no secondary insurance is provided ok to bill to Medicare which requires no PA.

## 2020-07-06 ENCOUNTER — Telehealth: Payer: Self-pay | Admitting: *Deleted

## 2020-07-06 NOTE — Telephone Encounter (Signed)
(  07/06/2020) Left message for pt to notify them that it is time to schedule annual low dose lung cancer screening CT scan. Instructed patient to call back to verify information prior to the scan being scheduled SRW

## 2020-07-17 ENCOUNTER — Telehealth: Payer: Self-pay | Admitting: *Deleted

## 2020-07-17 NOTE — Telephone Encounter (Signed)
Left a voicemail to inform patient that it is time for his lung cancer screening scan. Instructed patient in message to call back to update his information and get his CT scan scheduled.

## 2020-07-31 ENCOUNTER — Telehealth: Payer: Self-pay

## 2020-07-31 NOTE — Telephone Encounter (Signed)
Message left notifying patient that it is time to schedule the low dose lung cancer screening CT scan.  Instructed patient to return call to Shawn Perkins at [phone] to verify information prior to CT scan being scheduled.    

## 2020-09-27 ENCOUNTER — Ambulatory Visit: Payer: Self-pay

## 2020-09-27 ENCOUNTER — Encounter: Payer: Self-pay | Admitting: Urology

## 2020-10-07 ENCOUNTER — Telehealth: Payer: Self-pay | Admitting: Urology

## 2020-10-07 NOTE — Telephone Encounter (Signed)
Jack Welch is due for his ADT this month.  Would you check to make sure he is covered through his insurance and get him scheduled?

## 2020-10-07 NOTE — Telephone Encounter (Signed)
Yes he was due, and was scheduled for 09/27/2020 and no showed that appointment. A letter was mailed when he no showed.

## 2020-10-08 ENCOUNTER — Telehealth: Payer: Self-pay

## 2020-10-08 NOTE — Telephone Encounter (Signed)
Benefit verification form faxed with new active coverage.

## 2020-10-08 NOTE — Telephone Encounter (Signed)
He has an appointment on the 1st with the cancer center for labs.  If he is approved for his Eligard, I will call the cancer center and ask them to send him our way for the injection that day.

## 2020-10-08 NOTE — Telephone Encounter (Signed)
Called pt's mobile number which is inactive. Called mobile listed on pt's DPR, no answer. Called pt's emergency contact Iran Sizer, LM for her to call back or have pt call back to reschedule appointment.   Pt's insurance is currently back active as of 10/08/2020.

## 2020-10-11 ENCOUNTER — Other Ambulatory Visit: Payer: Self-pay | Admitting: *Deleted

## 2020-10-11 ENCOUNTER — Other Ambulatory Visit: Payer: Self-pay

## 2020-10-11 ENCOUNTER — Ambulatory Visit (INDEPENDENT_AMBULATORY_CARE_PROVIDER_SITE_OTHER): Payer: Medicare Other

## 2020-10-11 DIAGNOSIS — C61 Malignant neoplasm of prostate: Secondary | ICD-10-CM

## 2020-10-11 MED ORDER — LEUPROLIDE ACETATE (6 MONTH) 45 MG ~~LOC~~ KIT
45.0000 mg | PACK | Freq: Once | SUBCUTANEOUS | Status: AC
  Administered 2020-10-11: 45 mg via SUBCUTANEOUS

## 2020-10-11 NOTE — Progress Notes (Signed)
Eligard SubQ Injection   Due to Prostate Cancer patient is present today for a Eligard Injection.  Medication: Eligard 6 month Dose: 45 mg  Location: right upper outer buttocks   Lot: [id number] Exp: 01/2022  Patient tolerated well, no complications were noted.   Performed by: Gordy Clement, [place] ([place])   Per Dr. Baruch Gouty patient is to continue therapy for 2 years with end date 09/2021. Patient's next follow up was scheduled for Apr 11, 2021. This appointment was scheduled using wheel and given to patient today along with reminder continue on Vitamin D 800-1000iu and Calium 1000-1200mg  daily while on Androgen Deprivation Therapy.  PA approval dates: PA pending. Pt scheduled prior to PA completion.

## 2020-10-11 NOTE — Patient Instructions (Signed)
Please take calcium and vitamin D  Leuprolide depot injection What is this medicine? LEUPROLIDE (loo PROE lide) is a man-made protein that acts like a natural hormone in the body. It decreases testosterone in men and decreases estrogen in women. In men, this medicine is used to treat advanced prostate cancer. In women, some forms of this medicine may be used to treat endometriosis, uterine fibroids, or other male hormone-related problems. This medicine may be used for other purposes; ask your health care provider or pharmacist if you have questions. COMMON BRAND NAME(S): Eligard, Fensolv, Lupron Depot, Lupron Depot-Ped, Viadur What should I tell my health care provider before I take this medicine? They need to know if you have any of these conditions:  diabetes  heart disease or previous heart attack  high blood pressure  high cholesterol  mental illness  osteoporosis  pain or difficulty passing urine  seizures  spinal cord metastasis  stroke  suicidal thoughts, plans, or attempt; a previous suicide attempt by you or a family member  tobacco smoker  unusual vaginal bleeding (women)  an unusual or allergic reaction to leuprolide, benzyl alcohol, other medicines, foods, dyes, or preservatives  pregnant or trying to get pregnant  breast-feeding How should I use this medicine? This medicine is for injection into a muscle or for injection under the skin. It is given by a health care professional in a hospital or clinic setting. The specific product will determine how it will be given to you. Make sure you understand which product you receive and how often you will receive it. Talk to your pediatrician regarding the use of this medicine in children. Special care may be needed. Overdosage: If you think you have taken too much of this medicine contact a poison control center or emergency room at once. NOTE: This medicine is only for you. Do not share this medicine with  others. What if I miss a dose? It is important not to miss a dose. Call your doctor or health care professional if you are unable to keep an appointment. Depot injections: Depot injections are given either once-monthly, every 12 weeks, every 16 weeks, or every 24 weeks depending on the product you are prescribed. The product you are prescribed will be based on if you are male or male, and your condition. Make sure you understand your product and dosing. What may interact with this medicine? Do not take this medicine with any of the following medications:  chasteberry This medicine may also interact with the following medications:  herbal or dietary supplements, like black cohosh or DHEA  male hormones, like estrogens or progestins and birth control pills, patches, rings, or injections  male hormones, like testosterone This list may not describe all possible interactions. Give your health care provider a list of all the medicines, herbs, non-prescription drugs, or dietary supplements you use. Also tell them if you smoke, drink alcohol, or use illegal drugs. Some items may interact with your medicine. What should I watch for while using this medicine? Visit your doctor or health care professional for regular checks on your progress. During the first weeks of treatment, your symptoms may get worse, but then will improve as you continue your treatment. You may get hot flashes, increased bone pain, increased difficulty passing urine, or an aggravation of nerve symptoms. Discuss these effects with your doctor or health care professional, some of them may improve with continued use of this medicine. Male patients may experience a menstrual cycle or spotting during the  first months of therapy with this medicine. If this continues, contact your doctor or health care professional. This medicine may increase blood sugar. Ask your healthcare provider if changes in diet or medicines are needed if you  have diabetes. What side effects may I notice from receiving this medicine? Side effects that you should report to your doctor or health care professional as soon as possible:  allergic reactions like skin rash, itching or hives, swelling of the face, lips, or tongue  breathing problems  chest pain  depression or memory disorders  pain in your legs or groin  pain at site where injected or implanted  seizures  severe headache  signs and symptoms of high blood sugar such as being more thirsty or hungry or having to urinate more than normal. You may also feel very tired or have blurry vision  swelling of the feet and legs  suicidal thoughts or other mood changes  visual changes  vomiting Side effects that usually do not require medical attention (report to your doctor or health care professional if they continue or are bothersome):  breast swelling or tenderness  decrease in sex drive or performance  diarrhea  hot flashes  loss of appetite  muscle, joint, or bone pains  nausea  redness or irritation at site where injected or implanted  skin problems or acne This list may not describe all possible side effects. Call your doctor for medical advice about side effects. You may report side effects to FDA at 1-800-FDA-1088. Where should I keep my medicine? This drug is given in a hospital or clinic and will not be stored at home. NOTE: This sheet is a summary. It may not cover all possible information. If you have questions about this medicine, talk to your doctor, pharmacist, or health care provider.  2020 Elsevier/Gold Standard (2018-09-05 09:27:03)

## 2020-10-20 ENCOUNTER — Inpatient Hospital Stay: Payer: Medicare Other | Attending: Radiation Oncology

## 2020-10-20 ENCOUNTER — Other Ambulatory Visit: Payer: Self-pay

## 2020-10-20 DIAGNOSIS — C61 Malignant neoplasm of prostate: Secondary | ICD-10-CM | POA: Diagnosis present

## 2020-10-20 LAB — PSA: Prostatic Specific Antigen: 0.04 ng/mL (ref 0.00–4.00)

## 2020-10-27 ENCOUNTER — Encounter: Payer: Self-pay | Admitting: Radiation Oncology

## 2020-10-28 ENCOUNTER — Encounter: Payer: Self-pay | Admitting: Radiation Oncology

## 2020-10-28 ENCOUNTER — Other Ambulatory Visit: Payer: Self-pay

## 2020-10-28 ENCOUNTER — Ambulatory Visit
Admission: RE | Admit: 2020-10-28 | Discharge: 2020-10-28 | Disposition: A | Discharge status: Home / Self Care | Payer: Medicare Other | Source: Ambulatory Visit | Attending: Radiation Oncology | Admitting: Radiation Oncology

## 2020-10-28 VITALS — BP 146/81 | HR 81 | Temp 95.0°F | Wt 245.0 lb

## 2020-10-28 DIAGNOSIS — C61 Malignant neoplasm of prostate: Secondary | ICD-10-CM | POA: Insufficient documentation

## 2020-10-28 DIAGNOSIS — N529 Male erectile dysfunction, unspecified: Secondary | ICD-10-CM | POA: Insufficient documentation

## 2020-10-28 DIAGNOSIS — Z923 Personal history of irradiation: Secondary | ICD-10-CM | POA: Insufficient documentation

## 2020-10-28 NOTE — Progress Notes (Signed)
Radiation Oncology Follow up Note  Name: Jack Welch   Date:   10/28/2020 MRN:  [id number] DOB: [birth year]    This 74 y.o. male presents to the clinic today for 23-month follow-up status post I-125 interstitial implant for boost and patient previously treated with external beam radiation therapy to his prostate and pelvic nodes for stage IIb Gleason 8 adenocarcinoma presenting with a PSA of 22.5.Marland Kitchen  REFERRING PROVIDER: Crissie Figures, PA-C  HPI: Patient is a  74 year old male who received both external beam radiation therapy to his prostate and pelvic nodes as well as an I-125 interstitial implant for boost for Gleason 8 (5+3) adenocarcinoma prostate presenting with a PSA of 22.5.  He is seen today in routine follow-up he is having some erectile dysfunction has been given Viagra for that although he is not filled out prescription.  He is bowels tend towards constipation.  He specifically denies any increased lower urinary tract symptoms.  He is currently on ADT therapy through you urology.  His most recent PSA is 9.37  COMPLICATIONS OF TREATMENT: none  FOLLOW UP COMPLIANCE: keeps appointments   PHYSICAL EXAM:  BP (!) 146/81   Pulse 81   Temp (!) 95 F (35 C) (Tympanic)   Wt 245 lb (111.1 kg)   BMI 31.46 kg/m  Well-developed well-nourished patient in NAD. HEENT reveals PERLA, EOMI, discs not visualized.  Oral cavity is clear. No oral mucosal lesions are identified. Neck is clear without evidence of cervical or supraclavicular adenopathy. Lungs are clear to A&P. Cardiac examination is essentially unremarkable with regular rate and rhythm without murmur rub or thrill. Abdomen is benign with no organomegaly or masses noted. Motor sensory and DTR levels are equal and symmetric in the upper and lower extremities. Cranial nerves II through XII are grossly intact. Proprioception is intact. No peripheral adenopathy or edema is identified. No motor or sensory levels are noted. Crude visual fields  are within normal range.  RADIOLOGY RESULTS: No current films to review  PLAN: Present time patient is doing well under excellent biochemical control of his prostate cancer.  He continues on ADT therapy through urology.  I have asked to see him back in 6 months for follow-up.  Patient knows to call sooner with any concerns.  I would like to take this opportunity to thank you for allowing me to participate in the care of your patient.[name], MD

## 2020-10-29 NOTE — Telephone Encounter (Signed)
Incoming fax from [facility] no PA required for Lupron/Eligard. See scanned document.

## 2020-11-24 ENCOUNTER — Encounter: Payer: Self-pay | Admitting: *Deleted

## 2020-11-24 ENCOUNTER — Other Ambulatory Visit: Payer: Self-pay | Admitting: Radiation Oncology

## 2020-12-06 ENCOUNTER — Other Ambulatory Visit: Payer: Self-pay | Admitting: *Deleted

## 2020-12-06 DIAGNOSIS — Z122 Encounter for screening for malignant neoplasm of respiratory organs: Secondary | ICD-10-CM

## 2020-12-06 DIAGNOSIS — Z87891 Personal history of nicotine dependence: Secondary | ICD-10-CM

## 2020-12-06 NOTE — Progress Notes (Signed)
Patient contacted and scheduled for annual lung screening scan. Patient is a former smoker, quit 8 years ago. 36 pack year history.

## 2020-12-15 ENCOUNTER — Other Ambulatory Visit: Payer: Self-pay

## 2020-12-15 ENCOUNTER — Ambulatory Visit
Admission: RE | Admit: 2020-12-15 | Discharge: 2020-12-15 | Disposition: A | Discharge status: Home / Self Care | Payer: Medicare Other | Source: Ambulatory Visit | Attending: Nurse Practitioner | Admitting: Nurse Practitioner

## 2020-12-15 DIAGNOSIS — Z122 Encounter for screening for malignant neoplasm of respiratory organs: Secondary | ICD-10-CM | POA: Diagnosis present

## 2020-12-15 DIAGNOSIS — Z87891 Personal history of nicotine dependence: Secondary | ICD-10-CM | POA: Diagnosis not present

## 2020-12-17 ENCOUNTER — Encounter: Payer: Self-pay | Admitting: *Deleted

## 2021-03-04 IMAGING — CT CT CHEST LUNG CANCER SCREENING LOW DOSE W/O CM
2 of 5 series · 15 of 40 positions shown, 18 images · non-contrast
Comparison: None.

CLINICAL DATA: 72-year-old male with 36 pack-year history of
smoking. Lung cancer screening.

EXAM:
CT CHEST WITHOUT CONTRAST LOW-DOSE FOR LUNG CANCER SCREENING
TECHNIQUE: Multidetector CT imaging of the chest was performed following the
standard protocol without IV contrast.

[Series 3: lung · axial · 0.69mm/px · z∈[-1330,-963]mm · 12 of 407 slices shown, 15 images]
[im 20/407  mediastinal]
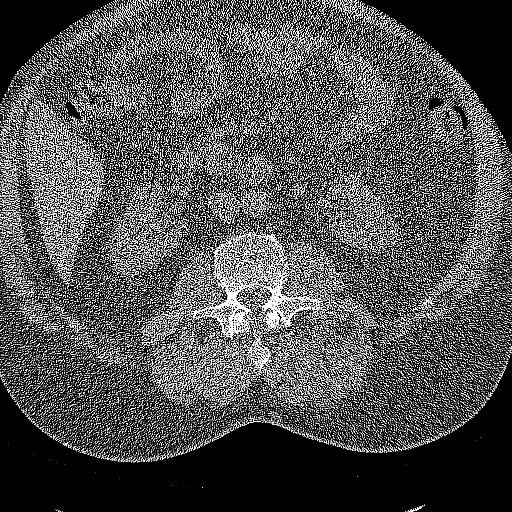
[im 20/407  lung]
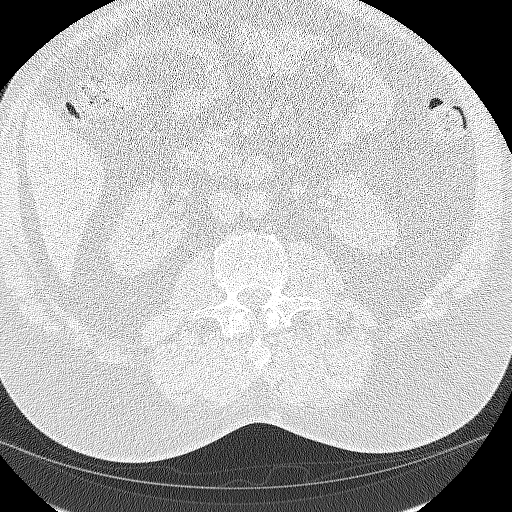
[im 59/407  lung]
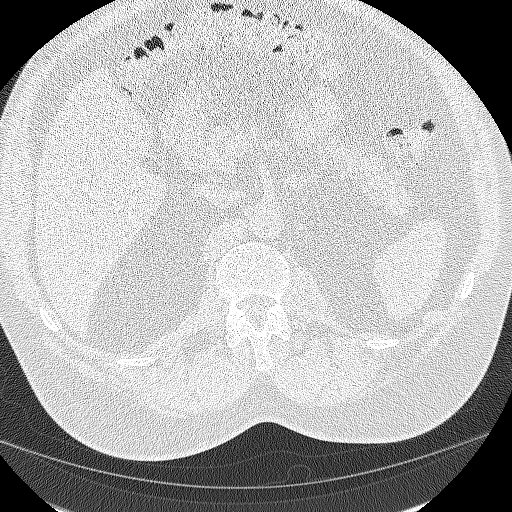
[im 97/407  lung]
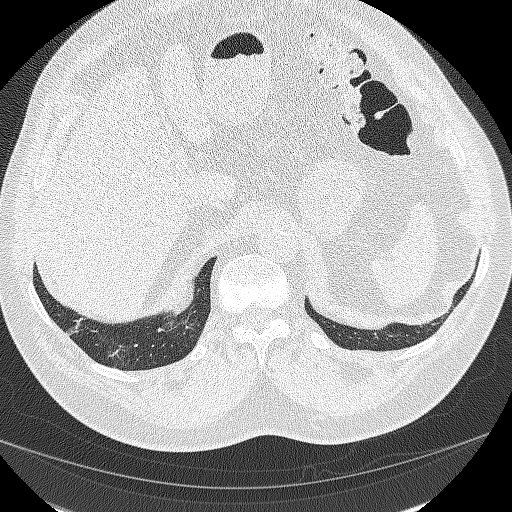
[im 117/407  lung]
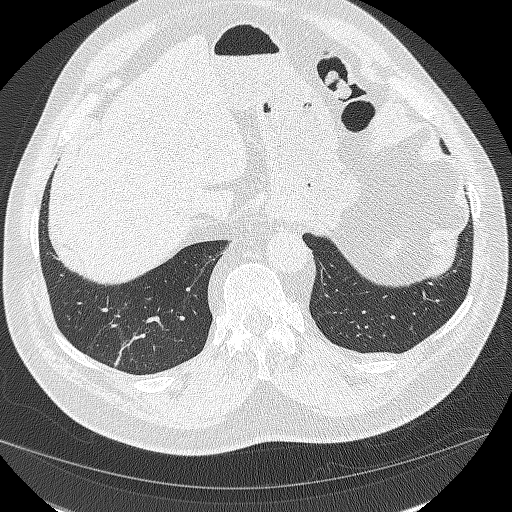
[im 155/407  mediastinal]
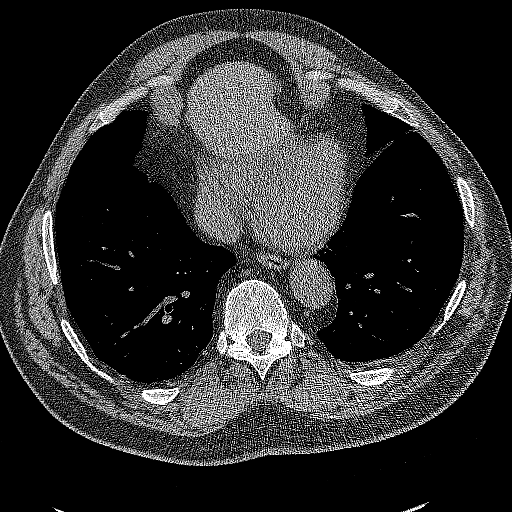
[im 155/407  lung]
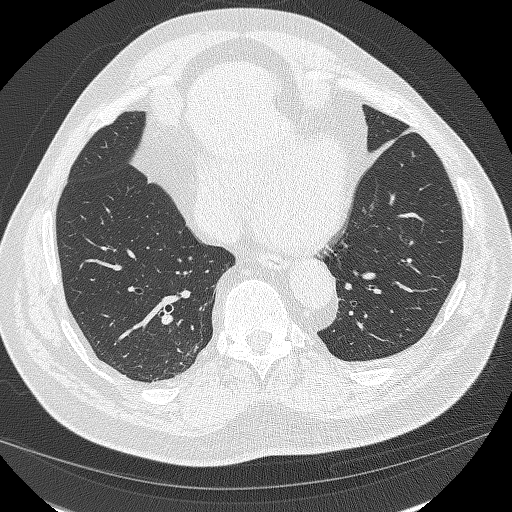
[im 194/407  lung]
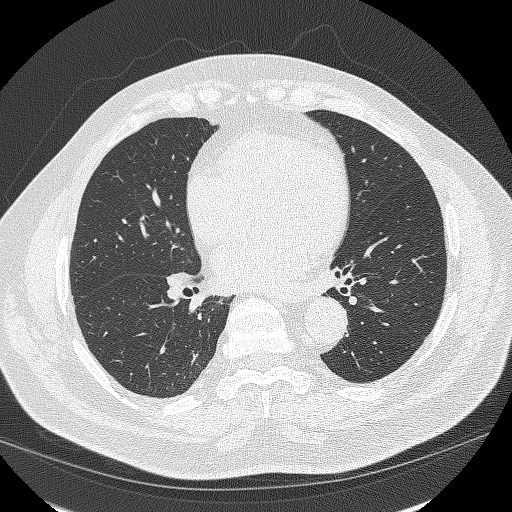
[im 213/407  lung]
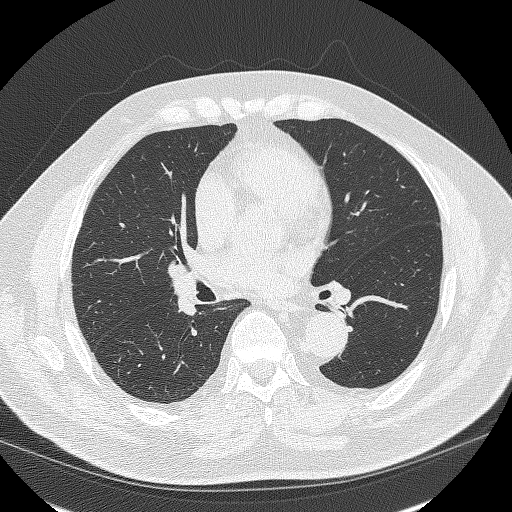
[im 252/407  lung]
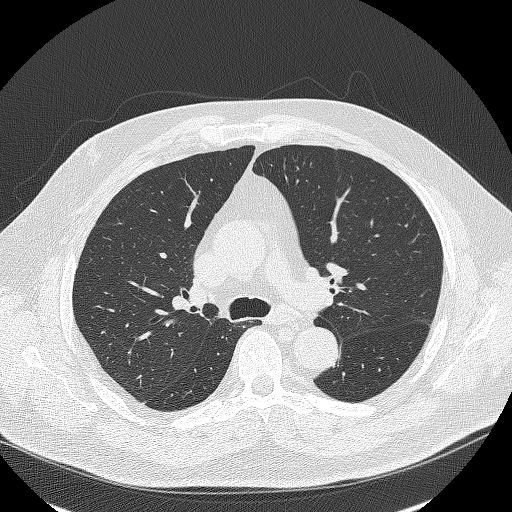
[im 291/407  mediastinal]
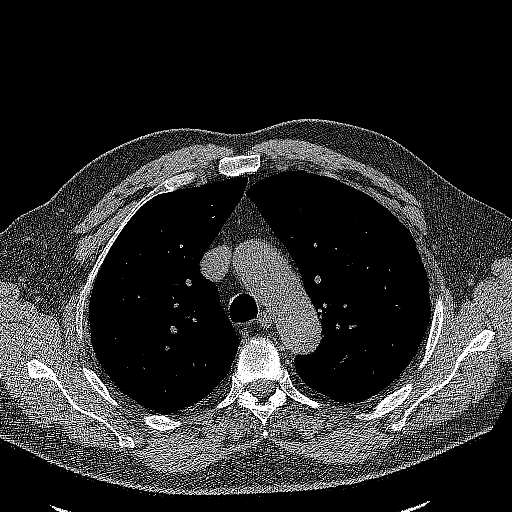
[im 291/407  lung]
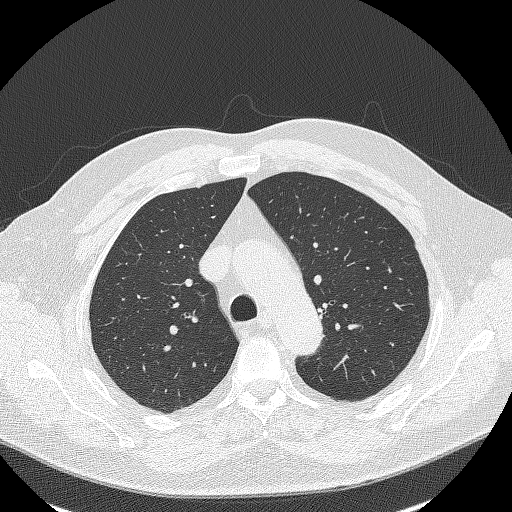
[im 310/407  lung]
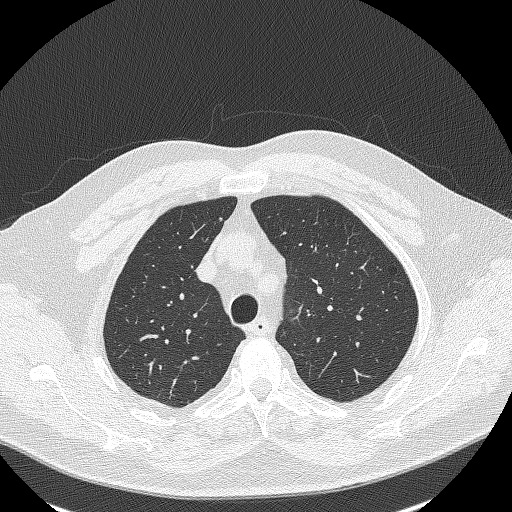
[im 349/407  lung]
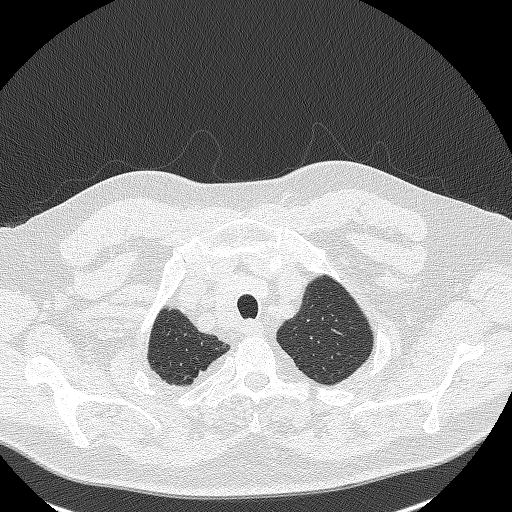
[im 387/407  lung]
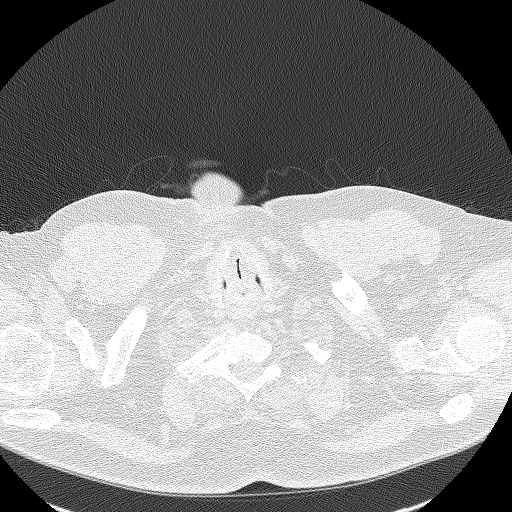

[Series 4: coronal lung · coronal · 0.69mm/px · 3 of 344 slices shown]
[im 69/344  lung]
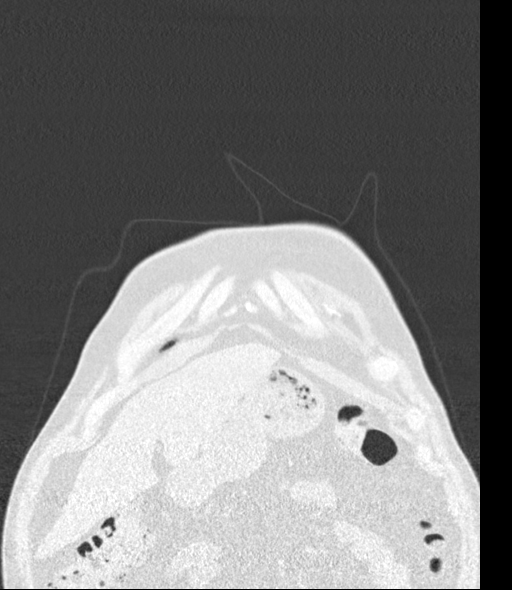
[im 138/344  lung]
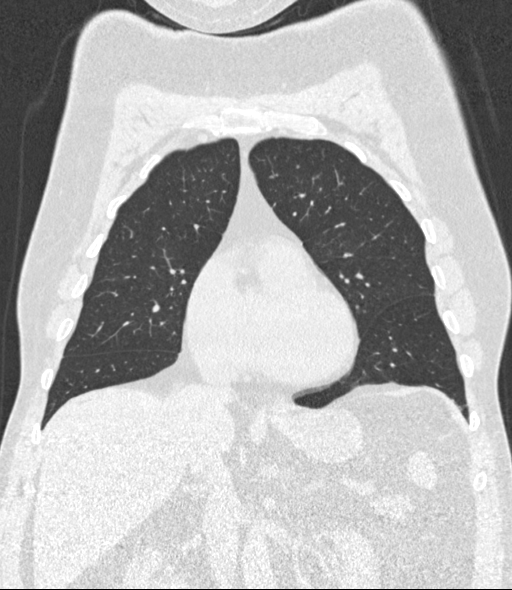
[im 206/344  lung]
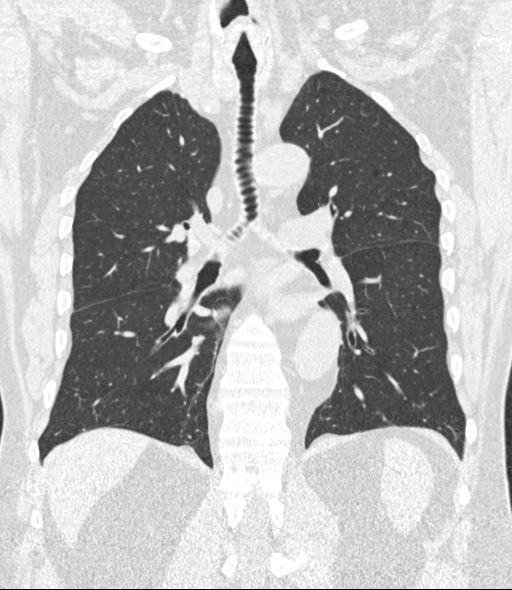

[15 of 40 positions shown; findings below may reference images not displayed]

FINDINGS: Cardiovascular: The heart size is normal. No substantial pericardial
effusion. No thoracic aortic aneurysm.

Mediastinum/Nodes: No mediastinal lymphadenopathy. There is no hilar
lymphadenopathy. The esophagus has normal imaging features. There is
no axillary lymphadenopathy.

Lungs/Pleura: Biapical pleuroparenchymal scarring noted.
Centrilobular emphsyema noted. Tiny bilateral pulmonary nodules
measure up to 2.3 mm. No suspicious nodule or mass. No pleural
effusion.

Upper Abdomen: Right renal cyst incompletely visualized but assessed
by ultrasound on 07/18/2019. Otherwise unremarkable.

Musculoskeletal: No worrisome lytic or sclerotic osseous
abnormality.
IMPRESSION: 1. Lung-RADS 2, benign appearance or behavior. Continue annual
screening with low-dose chest CT without contrast in 12 months.
2.  Emphysema. ([id number])

## 2021-04-11 ENCOUNTER — Other Ambulatory Visit: Payer: Self-pay

## 2021-04-11 ENCOUNTER — Ambulatory Visit (INDEPENDENT_AMBULATORY_CARE_PROVIDER_SITE_OTHER): Payer: Medicare Other | Admitting: Family Medicine

## 2021-04-11 DIAGNOSIS — C61 Malignant neoplasm of prostate: Secondary | ICD-10-CM

## 2021-04-11 MED ORDER — LEUPROLIDE ACETATE (6 MONTH) 45 MG ~~LOC~~ KIT
45.0000 mg | PACK | Freq: Once | SUBCUTANEOUS | Status: AC
  Administered 2021-04-11: 45 mg via SUBCUTANEOUS

## 2021-04-11 NOTE — Addendum Note (Signed)
Addended by: Kyra Manges on: 04/11/2021 09:17 AM   Modules accepted: Level of Service

## 2021-04-11 NOTE — Progress Notes (Signed)
Eligard SubQ Injection   Due to Prostate Cancer patient is present today for a Eligard Injection.  Medication: Eligard 6 month Dose: 45 mg  Location: right  Lot: [id number] Exp: 08/2022  Patient tolerated well, no complications were noted  Performed by: Elberta Leatherwood, [place]  Per Dr. Baruch Gouty patient is to continue therapy for 2 years . Patient's next follow up was scheduled for 10/03/2021. This appointment was scheduled using wheel and given to patient today along with reminder continue on Vitamin D 800-1000iu and Calium 1000-1200mg  daily while on Androgen Deprivation Therapy.  PA approval dates: No PA needed

## 2021-04-20 ENCOUNTER — Encounter: Payer: Self-pay | Admitting: Urology

## 2021-04-20 ENCOUNTER — Ambulatory Visit (INDEPENDENT_AMBULATORY_CARE_PROVIDER_SITE_OTHER): Payer: Medicare Other | Admitting: Urology

## 2021-04-20 ENCOUNTER — Other Ambulatory Visit: Payer: Self-pay

## 2021-04-20 VITALS — BP 102/65 | HR 86 | Ht 75.0 in | Wt 247.0 lb

## 2021-04-20 DIAGNOSIS — C61 Malignant neoplasm of prostate: Secondary | ICD-10-CM | POA: Diagnosis not present

## 2021-04-20 DIAGNOSIS — N529 Male erectile dysfunction, unspecified: Secondary | ICD-10-CM

## 2021-04-20 MED ORDER — TADALAFIL 10 MG PO TABS: ORAL_TABLET | ORAL | 11 refills | Status: DC

## 2021-04-20 NOTE — Progress Notes (Signed)
   04/20/2021 4:11 PM   Jack Welch 04/11/1946 [id number]  Reason for visit: Follow up prostate cancer, ED  HPI: 75 year old male with high risk prostate cancer treated with external beam radiation and brachytherapy(12/22/2019), as well as 2 years of ADT who presents for follow-up of his prostate cancer and worsening ED.  He has had an excellent response to prostate cancer treatment, and PSA is very low at 0.04.  He had ED prior to treatment, but was not sexually active at that time.  He has had some increased trouble with erections since treatment for prostate cancer and being on the ADT(last 77-month injection due 09/2021).  He was previously prescribed Viagra, but it does not sound like he ever tried this medication.  We discussed the differences between the PDE 5 inhibitors sildenafil and tadalafil and the risks and benefits at length.  He is interested in trial of tadalafil.  I recommended 10 mg every other day, and this could also be taken on demand.  Max dose would be 20 mg.  RTC in November for last 38-month dose of ADT, sooner if no improvement in ED on above strategies   [name], MD  [facility] [address], [place], [place] [address] [phone]

## 2021-04-28 ENCOUNTER — Inpatient Hospital Stay: Payer: Medicare Other | Attending: Radiation Oncology

## 2021-04-28 ENCOUNTER — Other Ambulatory Visit: Payer: Self-pay

## 2021-04-28 DIAGNOSIS — C61 Malignant neoplasm of prostate: Secondary | ICD-10-CM | POA: Diagnosis present

## 2021-04-28 LAB — PSA: Prostatic Specific Antigen: <0.01 ng/mL (ref 0.00–4.00)

## 2021-05-05 ENCOUNTER — Ambulatory Visit: Payer: Medicare Other | Admitting: Radiation Oncology

## 2021-05-12 ENCOUNTER — Other Ambulatory Visit: Payer: Self-pay

## 2021-05-12 ENCOUNTER — Other Ambulatory Visit: Payer: Self-pay | Admitting: *Deleted

## 2021-05-12 ENCOUNTER — Encounter: Payer: Self-pay | Admitting: Radiation Oncology

## 2021-05-12 ENCOUNTER — Ambulatory Visit
Admission: RE | Admit: 2021-05-12 | Discharge: 2021-05-12 | Disposition: A | Discharge status: Home / Self Care | Payer: Medicare Other | Source: Ambulatory Visit | Attending: Radiation Oncology | Admitting: Radiation Oncology

## 2021-05-12 VITALS — BP 116/70 | HR 81 | Temp 96.6°F | Resp 16 | Wt 247.7 lb

## 2021-05-12 DIAGNOSIS — N529 Male erectile dysfunction, unspecified: Secondary | ICD-10-CM | POA: Diagnosis not present

## 2021-05-12 DIAGNOSIS — C61 Malignant neoplasm of prostate: Secondary | ICD-10-CM | POA: Diagnosis not present

## 2021-05-12 DIAGNOSIS — K5900 Constipation, unspecified: Secondary | ICD-10-CM | POA: Insufficient documentation

## 2021-05-12 DIAGNOSIS — C775 Secondary and unspecified malignant neoplasm of intrapelvic lymph nodes: Secondary | ICD-10-CM | POA: Diagnosis not present

## 2021-05-12 DIAGNOSIS — Z923 Personal history of irradiation: Secondary | ICD-10-CM | POA: Diagnosis not present

## 2021-05-12 DIAGNOSIS — R351 Nocturia: Secondary | ICD-10-CM | POA: Diagnosis not present

## 2021-05-12 NOTE — Progress Notes (Signed)
Radiation Oncology Follow up Note  Name: Jack Welch   Date:   05/12/2021 MRN:  [id number] DOB: 16-Jun-1946    This 75 y.o. male presents to the clinic today for 6-month follow-up status post I-125 interstitial implant for boost in patient previously treated with external beam radiation therapy to his prostate and pelvic nodes for stage IIb Gleason 8 adenocarcinoma presenting with a PSA of 22.5.Marland Kitchen  REFERRING PROVIDER: Crissie Figures, PA-C  HPI: Patient is a 75 year old male who received both external beam radiation therapy to his prostate and pelvic nodes as well as I-125 interstitial implant for Gleason 8 (5+3) adenocarcinoma presenting with a PSA of 22.5 seen today in routine follow-up he is doing fairly well specifically denies any increased lower urinary tract symptoms.  He has been having some nocturia x3-4 although ran out of Flomax and just got that refilled.  He tends towards constipation.  He is currently on ADT therapy his PSA is less than 0.01.  He is being treated by urology for erectile dysfunction although this pretty dated his radiation therapy.Marland Kitchen  He recently had a screening CT scan of the chest which I have reviewed showing no evidence of pulmonary disease  COMPLICATIONS OF TREATMENT: none  FOLLOW UP COMPLIANCE: keeps appointments   PHYSICAL EXAM:  BP 116/70 (BP Location: Left Arm, Patient Position: Sitting)   Pulse 81   Temp (!) 96.6 F (35.9 C) (Tympanic)   Resp 16   Wt 247 lb 11.2 oz (112.4 kg)   BMI 30.96 kg/m  Well-developed well-nourished patient in NAD. HEENT reveals PERLA, EOMI, discs not visualized.  Oral cavity is clear. No oral mucosal lesions are identified. Neck is clear without evidence of cervical or supraclavicular adenopathy. Lungs are clear to A&P. Cardiac examination is essentially unremarkable with regular rate and rhythm without murmur rub or thrill. Abdomen is benign with no organomegaly or masses noted. Motor sensory and DTR levels are equal and  symmetric in the upper and lower extremities. Cranial nerves II through XII are grossly intact. Proprioception is intact. No peripheral adenopathy or edema is identified. No motor or sensory levels are noted. Crude visual fields are within normal range.  RADIOLOGY RESULTS: Screening CT scan reviewed  PLAN: At the present time patient is doing well under excellent biochemical control of his prostate cancer.  He continues on ADT therapy through urology.  I am pleased with his overall progress.  Of asked to see him back in 6 months and then will start once year follow-up visits.  Patient knows to call with any concerns.  I have suggested taking a daily fiber such as Senokot for his constipation.  I would like to take this opportunity to thank you for allowing me to participate in the care of your patient.[name], MD

## 2021-09-08 ENCOUNTER — Encounter: Payer: Self-pay | Admitting: General Surgery

## 2021-10-03 ENCOUNTER — Other Ambulatory Visit: Payer: Self-pay

## 2021-10-03 ENCOUNTER — Ambulatory Visit: Payer: Medicare Other

## 2021-10-10 ENCOUNTER — Telehealth: Payer: Self-pay

## 2021-10-10 ENCOUNTER — Ambulatory Visit: Payer: Medicare Other | Admitting: *Deleted

## 2021-10-10 ENCOUNTER — Other Ambulatory Visit: Payer: Self-pay

## 2021-10-10 MED ORDER — LEUPROLIDE ACETATE (6 MONTH) 45 MG ~~LOC~~ KIT: 45.0000 mg | PACK | Freq: Once | SUBCUTANEOUS | Status: AC

## 2021-10-10 NOTE — Progress Notes (Signed)
Error does not need eligard

## 2021-10-10 NOTE — Telephone Encounter (Signed)
Agree, no further ADT needed. Keep June follow up with me   Nickolas Madrid, MD  10/10/2021    Called pt informed him of the information above. Pt gave verbal understanding.

## 2021-10-10 NOTE — Telephone Encounter (Signed)
Pt was on the nurse schedule today for an Eligard injection, however upon review of his flow sheet it appears he has already had 63yrs worth of ADT with the following administration dates:  10/02/19 03/31/20 10/11/20 04/11/21  Will you please just double check this. We let the patient go and told him we would call him if he did need the injection.

## 2022-01-18 ENCOUNTER — Telehealth: Payer: Self-pay | Admitting: *Deleted

## 2022-01-18 NOTE — Telephone Encounter (Signed)
LMTC to schedule Yearly Lung CA CT Scan. 

## 2022-02-20 ENCOUNTER — Other Ambulatory Visit: Payer: Self-pay | Admitting: *Deleted

## 2022-02-20 DIAGNOSIS — Z87891 Personal history of nicotine dependence: Secondary | ICD-10-CM

## 2022-02-24 ENCOUNTER — Ambulatory Visit
Admission: RE | Admit: 2022-02-24 | Discharge: 2022-02-24 | Disposition: A | Discharge status: Home / Self Care | Payer: Medicare Other | Source: Ambulatory Visit | Attending: Acute Care | Admitting: Acute Care

## 2022-02-24 DIAGNOSIS — Z87891 Personal history of nicotine dependence: Secondary | ICD-10-CM | POA: Diagnosis present

## 2022-02-27 ENCOUNTER — Telehealth: Payer: Self-pay | Admitting: Acute Care

## 2022-02-27 NOTE — Telephone Encounter (Signed)
Spoke with patient by phone to review LDCT results. Emphysema and atherosclerosis as noted previously.  No suspicious findings for lung cancer.  New recommendation for thyroid ultrasound, as nodule is present.  Patient is unaware of previous history of thyroid condition or nodule.  Will route results to PCP office (patient states provider will be changing as his provider [facility] is leaving the office) for review/recommendation.  Patient also confirmed he quit smoking in 2018 which will be a 15 year non-smoker and he will no longer be eligible for LCS LCDT.  We will not be calling to schedule him next year but patient can discuss with PCP about future Chest CT which could be placed by PCP provider, as recommended. Patient acknowledged understanding and will discuss with PCP.  Patient has no further questions.  Patient will discuss findings with new PCP provider. He recently had labwork and is expecting follow up call.   ?

## 2022-03-03 ENCOUNTER — Ambulatory Visit: Payer: Medicare Other

## 2022-04-20 ENCOUNTER — Ambulatory Visit (INDEPENDENT_AMBULATORY_CARE_PROVIDER_SITE_OTHER): Payer: Medicare Other | Admitting: Urology

## 2022-04-20 ENCOUNTER — Encounter: Payer: Self-pay | Admitting: Urology

## 2022-04-20 VITALS — BP 147/90 | HR 73 | Ht 75.0 in | Wt 240.0 lb

## 2022-04-20 DIAGNOSIS — C61 Malignant neoplasm of prostate: Secondary | ICD-10-CM

## 2022-04-20 DIAGNOSIS — N529 Male erectile dysfunction, unspecified: Secondary | ICD-10-CM

## 2022-04-20 MED ORDER — TADALAFIL 20 MG PO TABS: 20.0000 mg | ORAL_TABLET | ORAL | 6 refills | Status: DC

## 2022-04-20 MED ORDER — TAMSULOSIN HCL 0.4 MG PO CAPS: 0.4000 mg | ORAL_CAPSULE | Freq: Every day | ORAL | 11 refills | Status: DC

## 2022-04-20 NOTE — Addendum Note (Signed)
Addended by: [name] on: 04/20/2022 02:36 PM   Modules accepted: Orders

## 2022-04-20 NOTE — Progress Notes (Signed)
   04/20/2022 2:33 PM   Jack Welch Apr 19, 1946 [id number]  Reason for visit: Follow up prostate cancer, ED, urinary symptoms  HPI: 76 year old male with high risk prostate cancer treated with external beam radiation and brachytherapy(12/22/2019), as well as 1.5 years of ADT who presents for follow-up of his prostate cancer and worsening ED.  He has had an excellent response to prostate cancer treatment, and PSA was undetectable last year, pending today.  He no showed his visit for final 47-monthADT injection in November 2022.  We discussed the risk of recurrence, need for close monitoring of PSA, will call with PSA from today.  He has mild urinary symptoms of weak stream and some urgency since radiation that are currently very well controlled on Flomax, and he would like to continue this medication.  He had ED prior to treatment, but was not sexually active at that time.  He has had some increased trouble with erections since treatment for prostate cancer, and previously was on 10 mg Cialis every other day with only mild to moderate improvement in the erections.  We discussed options including the maximum dose of 20 mg either every other day or as needed, or considering penile injections.  He would like to start with the 20 mg Cialis first, but is interested in pursuing penile injections if persistent ED.  -Trial of Cialis 20 mg every other day or as needed for ED-> if persistent ED he would like to pursue penile injections and we can set this up with SEllie Lunch PA -Flomax refilled -PSA today, call with results   BBilley Co MD  [facility] [place] [phone]

## 2022-04-21 ENCOUNTER — Telehealth: Payer: Self-pay

## 2022-04-21 LAB — PSA: Prostate Specific Ag, Serum: <0.1 ng/mL (ref 0.0–4.0)

## 2022-04-21 NOTE — Telephone Encounter (Signed)
-----   Message from [name], MD sent at 04/21/2022  7:17 AM EDT ----- Doristine Devoid news, PSA 0, keep follow up with [facility] re:ED  Nickolas Madrid, MD 04/21/2022

## 2022-04-21 NOTE — Telephone Encounter (Signed)
Called pt informed him of the information below. Pt voiced understanding.  

## 2022-05-04 ENCOUNTER — Other Ambulatory Visit: Payer: Self-pay

## 2022-05-04 ENCOUNTER — Inpatient Hospital Stay: Payer: Medicare Other | Attending: Radiation Oncology

## 2022-05-04 DIAGNOSIS — C61 Malignant neoplasm of prostate: Secondary | ICD-10-CM | POA: Insufficient documentation

## 2022-05-04 LAB — PSA: Prostatic Specific Antigen: 0.01 ng/mL (ref 0.00–4.00)

## 2022-05-11 ENCOUNTER — Other Ambulatory Visit: Payer: Self-pay | Admitting: *Deleted

## 2022-05-11 ENCOUNTER — Encounter: Payer: Self-pay | Admitting: Radiation Oncology

## 2022-05-11 ENCOUNTER — Ambulatory Visit
Admission: RE | Admit: 2022-05-11 | Discharge: 2022-05-11 | Disposition: A | Discharge status: Home / Self Care | Payer: Medicare Other | Source: Ambulatory Visit | Attending: Radiation Oncology | Admitting: Radiation Oncology

## 2022-05-11 VITALS — BP 111/66 | HR 82 | Temp 97.2°F | Resp 20 | Ht 74.0 in | Wt 256.6 lb

## 2022-05-11 DIAGNOSIS — C61 Malignant neoplasm of prostate: Secondary | ICD-10-CM

## 2022-05-11 DIAGNOSIS — N529 Male erectile dysfunction, unspecified: Secondary | ICD-10-CM | POA: Diagnosis not present

## 2022-05-11 DIAGNOSIS — Z923 Personal history of irradiation: Secondary | ICD-10-CM | POA: Insufficient documentation

## 2022-05-11 DIAGNOSIS — C775 Secondary and unspecified malignant neoplasm of intrapelvic lymph nodes: Secondary | ICD-10-CM | POA: Diagnosis not present

## 2022-05-24 NOTE — Progress Notes (Signed)
05/25/22 1:24 PM   [name] 01/17/1946 [id number]  Referring provider:  No referring provider defined for this encounter.  Urological history:  1. Prostate cancer  - PSA, 04/2022-<0.1  - high risk prostate cancer  - external beam radiation and brachytherapy (12/22/2019), as well as 1.5 years of ADT   2. ED  -Contributing factors of age, prostate cancer, pelvic radiation, history of smoking and COPD  -SHIM   3. Renal cysts  -contrast CT, 2020 - Bilateral renal cysts are evident, measuring up to 3.5 cm in the right kidney and 3.3 cm in the left kidney   Chief Complaint  Patient presents with   Erectile Dysfunction      HPI: EDILSON Welch is a 76 y.o.male who presents today for further evaluation of erectile dysfunction.   He states he has not had any erections since his prostate cancer treatments.  Patient is not having spontaneous erections.  He denies any pain or curvature with erections.   He states the tadalafil 10 mg every other day helped a little.  He noticed a mild penile fullness with the regimen.  He has not picked up the tadalafil 20 mg that were prescribed to him because of financial concerns.     SHIM     Row Name 05/25/22 1306         SHIM: Over the last 6 months:   How do you rate your confidence that you could get and keep an erection? Very Low     When you had erections with sexual stimulation, how often were your erections hard enough for penetration (entering your partner)? Almost Never or Never     During sexual intercourse, how often were you able to maintain your erection after you had penetrated (entered) your partner? Almost Never or Never     During sexual intercourse, how difficult was it to maintain your erection to completion of intercourse? Extremely Difficult     When you attempted sexual intercourse, how often was it satisfactory for you? Almost Never or Never       SHIM Total Score   SHIM 5              Score: 1-7 Severe  ED 8-11 Moderate ED 12-16 Mild-Moderate ED 17-21 Mild ED 22-25 No ED       PMH: Past Medical History:  Diagnosis Date   Asthma    Cancer [facility]    Prostate    Surgical History: Past Surgical History:  Procedure Laterality Date   CATARACT EXTRACTION Right 1974   CYSTOSCOPY  12/22/2019   Procedure: CYSTOSCOPY;  Surgeon: [name], MD;  Location: [facility] ORS;  Service: Urology;;   none     RADIOACTIVE SEED IMPLANT N/A 12/22/2019   Procedure: RADIOACTIVE SEED IMPLANT/BRACHYTHERAPY IMPLANT;  Surgeon: [name], MD;  Location: [facility] ORS;  Service: Urology;  Laterality: N/A;    Home Medications:  Allergies as of 05/25/2022   No Known Allergies      Medication List        Accurate as of May 25, 2022  1:24 PM. If you have any questions, ask your nurse or doctor.          albuterol 108 (90 Base) MCG/ACT inhaler Commonly known as: VENTOLIN HFA Inhale 2 puffs into the lungs every 4 (four) hours as needed for wheezing or shortness of breath.   Flovent HFA 220 MCG/ACT inhaler Generic drug: fluticasone Inhale 2 puffs into the lungs in the morning  and at bedtime.   fluticasone 50 MCG/ACT nasal spray Commonly known as: FLONASE Place 2 sprays into both nostrils daily as needed (allergies.).   tadalafil 20 MG tablet Commonly known as: CIALIS Take 1 tablet (20 mg total) by mouth every other day.   tamsulosin 0.4 MG Caps capsule Commonly known as: FLOMAX Take 1 capsule (0.4 mg total) by mouth daily after supper.   Trelegy Ellipta 100-62.5-25 MCG/ACT Aepb Generic drug: Fluticasone-Umeclidin-Vilant Inhale 1 puff into the lungs daily.        Allergies:  No Known Allergies  Family History: Family History  Problem Relation Age of Onset   Prostate cancer Neg Hx    Bladder Cancer Neg Hx    Kidney cancer Neg Hx     Social History:  reports that he quit smoking about 9 years ago. His smoking use included cigarettes. He has a 36.00 pack-year smoking history. He  has been exposed to tobacco smoke. His smokeless tobacco use includes chew. He reports current alcohol use of about 1.0 - 2.0 standard drink of alcohol per week. He reports that he does not use drugs.   Physical Exam: BP 135/75   Pulse (!) 8   Ht '6\' 2"'$  (1.88 m)   Wt 255 lb (115.7 kg)   BMI 32.74 kg/m   Constitutional:  Alert and oriented, No acute distress. HEENT: [place] AT, moist mucus membranes.  Trachea midline Cardiovascular: No clubbing, cyanosis, or edema. Respiratory: Normal respiratory effort, no increased work of breathing. Neurologic: Grossly intact, no focal deficits, moving all 4 extremities. Psychiatric: Normal mood and affect.  Laboratory Data:  Prostatic Specific Antigen  Latest Ref Rng 0.00 - 4.00 ng/mL  04/15/2020 0.06   10/20/2020 0.04   04/28/2021 <0.01   05/04/2022 0.01    Pertinent Imaging: N/A  Assessment & Plan:    1. ED -patient did not have the financial means to pick up the tadalafil 20 mg daily  -We discussed how ICI is performed and of course there will be a cost with that mode of treatment as well -He states he now has the income to pick up the prescription for the tadalafil 20 mg and would like to try that first before pursuing [place]  Return in about 1 month (around 06/25/2022) for SHIM .  Zara Council, PA-C   [facility] [address] [place], [place] [address] [phone]  I spent 15 minutes on the day of the encounter to include pre-visit record review, face-to-face time with the patient, and post-visit ordering of tests.

## 2022-05-25 ENCOUNTER — Ambulatory Visit (INDEPENDENT_AMBULATORY_CARE_PROVIDER_SITE_OTHER): Payer: Medicare Other | Admitting: Urology

## 2022-05-25 ENCOUNTER — Encounter: Payer: Self-pay | Admitting: Urology

## 2022-05-25 ENCOUNTER — Telehealth: Payer: Self-pay | Admitting: Urology

## 2022-05-25 VITALS — BP 135/75 | HR 8 | Ht 74.0 in | Wt 255.0 lb

## 2022-05-25 DIAGNOSIS — N529 Male erectile dysfunction, unspecified: Secondary | ICD-10-CM

## 2022-07-03 NOTE — Progress Notes (Unsigned)
07/04/22 1:33 PM   Jack Welch [birth year] [id number]  Referring provider:  No referring provider defined for this encounter.  Urological history:  1. Prostate cancer  - PSA, 04/2022-<0.1  - high risk prostate cancer  - external beam radiation and brachytherapy (12/22/2019), as well as 1.5 years of ADT   2. ED  -Contributing factors of age, prostate cancer, pelvic radiation, history of smoking and COPD  -SHIM 18  3. Renal cysts  -contrast CT, 2020 - Bilateral renal cysts are evident, measuring up to 3.5 cm in the right kidney and 3.3 cm in the left kidney   Chief Complaint  Patient presents with   Erectile Dysfunction    1 mo follow up      HPI: Jack Welch is a 76 y.o.male who presents today for one month follow up after a trial of tadalafil 20 mg, on-demand-dosing.  He is taking tadalafil 20 mg every other day, but he still only experiencing penile fullness.  He denies any erections firm enough or maintain long enough for satisfactory intercourse.  Patient denies any modifying or aggravating factors.  Patient denies any gross hematuria, dysuria or suprapubic/flank pain.  Patient denies any fevers, chills, nausea or vomiting.     SHIM     Row Name 07/04/22 1308         SHIM: Over the last 6 months:   How do you rate your confidence that you could get and keep an erection? Very Low     When you had erections with sexual stimulation, how often were your erections hard enough for penetration (entering your partner)? Most Times (much more than half the time)     During sexual intercourse, how often were you able to maintain your erection after you had penetrated (entered) your partner? Sometimes (about half the time)     During sexual intercourse, how difficult was it to maintain your erection to completion of intercourse? Not Difficult     When you attempted sexual intercourse, how often was it satisfactory for you? Almost Always or Always       SHIM Total Score    SHIM 18               Score: 1-7 Severe ED 8-11 Moderate ED 12-16 Mild-Moderate ED 17-21 Mild ED 22-25 No ED       PMH: Past Medical History:  Diagnosis Date   Asthma    [facility]cer [facility])    Prostate    Surgical History: Past Surgical History:  Procedure Laterality Date   CATARACT EXTRACTION Right 1974   CYSTOSCOPY  12/22/2019   Procedure: CYSTOSCOPY;  Surgeon: [name], MD;  Location: [facility] ORS;  Service: Urology;;   none     RADIOACTIVE SEED IMPLANT N/A 12/22/2019   Procedure: RADIOACTIVE SEED IMPLANT/BRACHYTHERAPY IMPLANT;  Surgeon: [name], MD;  Location: [facility] ORS;  Service: Urology;  Laterality: N/A;    Home Medications:  Allergies as of 07/04/2022   No Known Allergies      Medication List        Accurate as of July 04, 2022  1:33 PM. If you have any questions, ask your nurse or doctor.          albuterol 108 (90 Base) MCG/ACT inhaler Commonly known as: VENTOLIN HFA Inhale 2 puffs into the lungs every 4 (four) hours as needed for wheezing or shortness of breath.   AMBULATORY NON FORMULARY MEDICATION Trimix (30/1/10)-(Pap/Phent/PGE)  Test Dose  23m vial  [facility] #[address] [place] [phone] Fax [phone] Started by: Zara Council, PA-C   Flovent HFA 220 MCG/ACT inhaler Generic drug: fluticasone Inhale 2 puffs into the lungs in the morning and at bedtime.   fluticasone 50 MCG/ACT nasal spray Commonly known as: FLONASE Place 2 sprays into both nostrils daily as needed (allergies.).   sildenafil 100 MG tablet Commonly known as: VIAGRA Take 1 tablet (100 mg total) by mouth daily as needed for erectile dysfunction. Take two hours prior to intercourse on an empty stomach Started by: Zara Council, PA-C   tadalafil 20 MG tablet Commonly known as: CIALIS Take 1 tablet (20 mg total) by mouth every other day.   tamsulosin 0.4 MG Caps capsule Commonly known as: FLOMAX Take 1 capsule (0.4 mg total) by  mouth daily after supper.   Trelegy Ellipta 100-62.5-25 MCG/ACT Aepb Generic drug: Fluticasone-Umeclidin-Vilant Inhale 1 puff into the lungs daily.        Allergies:  No Known Allergies  Family History: Family History  Problem Relation Age of Onset   Prostate cancer Neg Hx    Bladder Cancer Neg Hx    Kidney cancer Neg Hx     Social History:  reports that he quit smoking about 9 years ago. His smoking use included cigarettes. He has a 36.00 pack-year smoking history. He has been exposed to tobacco smoke. His smokeless tobacco use includes chew. He reports current alcohol use of about 1.0 - 2.0 standard drink of alcohol per week. He reports that he does not use drugs.   Physical Exam: BP 120/75   Pulse 75   Ht '6\' 2"'$  (1.88 m)   Wt 252 lb (114.3 kg)   BMI 32.35 kg/m   Constitutional:  Well nourished. Alert and oriented, No acute distress. HEENT: [place] AT, moist mucus membranes.  Trachea midline Cardiovascular: No clubbing, cyanosis, or edema. Respiratory: Normal respiratory effort, no increased work of breathing. Neurologic: Grossly intact, no focal deficits, moving all 4 extremities. Psychiatric: Normal mood and affect.   Laboratory Data: N/A  Pertinent Imaging: N/A  Assessment & Plan:    1. ED -His treatment plan is still hindered by financial issues -We again discussed ICI versus IPP -I suggested that he be seen in consultation for IPP as this is a surgical procedure and may be covered by his insurance and less costly in the long run, but he is hesitant at this time -He is interested in pursuing ICI titration appointment, but he will need to wait until he has the financial needs to do so -In the interim he would like to try another PDE 5 inhibitor, so I have sent a prescription for sildenafil 100 mg on demand dosing  2.  Prostate cancer -Patient will return in December for 48-monthfollow-up regarding his prostate cancer for PSA/IPS'S  Return for December 2023  -PSA/IPSS/SHIM .  SZara Council PA-C   [facility] [address] [place] [phone] I spent 15 minutes on the day of the encounter to include pre-visit record review, face-to-face time with the patient, and post-visit ordering of tests.

## 2022-07-04 ENCOUNTER — Encounter: Payer: Self-pay | Admitting: Urology

## 2022-07-04 ENCOUNTER — Ambulatory Visit (INDEPENDENT_AMBULATORY_CARE_PROVIDER_SITE_OTHER): Payer: Medicare Other | Admitting: Urology

## 2022-07-04 VITALS — BP 120/75 | HR 75 | Ht 74.0 in | Wt 252.0 lb

## 2022-07-04 DIAGNOSIS — N529 Male erectile dysfunction, unspecified: Secondary | ICD-10-CM

## 2022-07-04 DIAGNOSIS — C61 Malignant neoplasm of prostate: Secondary | ICD-10-CM | POA: Diagnosis not present

## 2022-07-04 MED ORDER — SILDENAFIL CITRATE 100 MG PO TABS: 100.0000 mg | ORAL_TABLET | Freq: Every day | ORAL | 1 refills | Status: DC | PRN

## 2022-07-04 MED ORDER — AMBULATORY NON FORMULARY MEDICATION: 0 refills | Status: DC

## 2022-07-05 NOTE — Telephone Encounter (Signed)
Error

## 2022-08-18 DIAGNOSIS — N5231 Erectile dysfunction following radical prostatectomy: Secondary | ICD-10-CM | POA: Insufficient documentation

## 2022-10-27 ENCOUNTER — Other Ambulatory Visit: Payer: Self-pay | Admitting: *Deleted

## 2022-10-27 DIAGNOSIS — C61 Malignant neoplasm of prostate: Secondary | ICD-10-CM

## 2022-10-27 DIAGNOSIS — N529 Male erectile dysfunction, unspecified: Secondary | ICD-10-CM

## 2022-10-30 ENCOUNTER — Other Ambulatory Visit: Payer: Medicare Other

## 2022-10-30 DIAGNOSIS — C61 Malignant neoplasm of prostate: Secondary | ICD-10-CM

## 2022-10-31 LAB — PSA: Prostate Specific Ag, Serum: <0.1 ng/mL (ref 0.0–4.0)

## 2022-11-01 NOTE — Progress Notes (Unsigned)
11/02/22 11:41 AM   [name] [birth year] [id number]  Referring provider:  No referring provider defined for this encounter.  Urological history:  1. Prostate cancer  - PSA (10/2022) <0.1 - high risk prostate cancer  - external beam radiation and brachytherapy (12/22/2019), as well as 1.5 years of ADT  -I PSS 6/1 -PVR 0 mL -tamsulosin 0.4 mg daily   2. ED  -Contributing factors of age, prostate cancer, pelvic radiation, history of smoking and COPD  -SHIM 5  3. Renal cysts  -contrast CT, 2020 - Bilateral renal cysts are evident, measuring up to 3.5 cm in the right kidney and 3.3 cm in the left kidney   Chief Complaint  Patient presents with   Erectile Dysfunction      HPI: Jack Welch is a 76 y.o.male who presents today for follow up.  I PSS 6/1  PVR 0 mL  He has no urinary complaints at this time.  Patient denies any modifying or aggravating factors.  Patient denies any gross hematuria, dysuria or suprapubic/flank pain.  Patient denies any fevers, chills, nausea or vomiting.     IPSS     Row Name 11/02/22 1100         International Prostate Symptom Score   How often have you had the sensation of not emptying your bladder? Less than 1 in 5     How often have you had to urinate less than every two hours? Not at All     How often have you found you stopped and started again several times when you urinated? Less than 1 in 5 times     How often have you found it difficult to postpone urination? Less than 1 in 5 times     How often have you had a weak urinary stream? Less than 1 in 5 times     How often have you had to strain to start urination? Less than 1 in 5 times     How many times did you typically get up at night to urinate? 1 Time     Total IPSS Score 6       Quality of Life due to urinary symptoms   If you were to spend the rest of your life with your urinary condition just the way it is now how would you feel about that? Pleased               Score:  1-7 Mild 8-19 Moderate 20-35 Severe    SHIM 5  He states the sildenafil is not working.  Patient is not having spontaneous erections.  He denies any pain or curvature with erections.     SHIM     Row Name 11/02/22 1129         SHIM: Over the last 6 months:   How do you rate your confidence that you could get and keep an erection? Very Low     When you had erections with sexual stimulation, how often were your erections hard enough for penetration (entering your partner)? Almost Never or Never     During sexual intercourse, how often were you able to maintain your erection after you had penetrated (entered) your partner? Almost Never or Never     During sexual intercourse, how difficult was it to maintain your erection to completion of intercourse? Extremely Difficult     When you attempted sexual intercourse, how often was it satisfactory for you? Almost Never or Never  SHIM Total Score   SHIM 5              Score: 1-7 Severe ED 8-11 Moderate ED 12-16 Mild-Moderate ED 17-21 Mild ED 22-25 No ED    PMH: Past Medical History:  Diagnosis Date   Asthma    Cancer [facility])    Prostate    Surgical History: Past Surgical History:  Procedure Laterality Date   CATARACT EXTRACTION Right 1974   CYSTOSCOPY  12/22/2019   Procedure: CYSTOSCOPY;  Surgeon: [name], MD;  Location: [facility] ORS;  Service: Urology;;   none     RADIOACTIVE SEED IMPLANT N/A 12/22/2019   Procedure: RADIOACTIVE SEED IMPLANT/BRACHYTHERAPY IMPLANT;  Surgeon: [name], MD;  Location: [facility] ORS;  Service: Urology;  Laterality: N/A;    Home Medications:  Allergies as of 11/02/2022   No Known Allergies      Medication List        Accurate as of November 02, 2022 11:41 AM. If you have any questions, ask your nurse or doctor.          albuterol 108 (90 Base) MCG/ACT inhaler Commonly known as: VENTOLIN HFA Inhale 2 puffs into the lungs every 4 (four) hours as needed  for wheezing or shortness of breath.   AMBULATORY NON FORMULARY MEDICATION Trimix (30/1/10)-(Pap/Phent/PGE)  Test Dose  74m vial   Qty #3 Refills 0  [facility] [phone] [phone]  Flovent HFA 220 MCG/ACT inhaler Generic drug: fluticasone Inhale 2 puffs into the lungs in the morning and at bedtime.   fluticasone 50 MCG/ACT nasal spray Commonly known as: FLONASE Place 2 sprays into both nostrils daily as needed (allergies.).   sildenafil 100 MG tablet Commonly known as: VIAGRA Take 1 tablet (100 mg total) by mouth daily as needed for erectile dysfunction. Take two hours prior to intercourse on an empty stomach What changed: Another medication with the same name was added. Make sure you understand how and when to take each. Changed by: SZara Council PA-C   sildenafil 100 MG tablet Commonly known as: VIAGRA Take two hours prior to intercourse on an empty stomach  He wants BRAND NAME VIAGRA What changed: You were already taking a medication with the same name, and this prescription was added. Make sure you understand how and when to take each. Changed by: SZara Council PA-C   tadalafil 20 MG tablet Commonly known as: CIALIS Take 1 tablet (20 mg total) by mouth every other day.   tamsulosin 0.4 MG Caps capsule Commonly known as: FLOMAX Take 1 capsule (0.4 mg total) by mouth daily after supper.   Trelegy Ellipta 100-62.5-25 MCG/ACT Aepb Generic drug: Fluticasone-Umeclidin-Vilant Inhale 1 puff into the lungs daily.        Allergies:  No Known Allergies  Family History: Family History  Problem Relation Age of Onset   Prostate cancer Neg Hx    Bladder Cancer Neg Hx    Kidney cancer Neg Hx     Social History:  reports that he quit smoking about 9 years ago. His smoking use included cigarettes. He has a 36.00 pack-year smoking history. He has been exposed to tobacco smoke. His smokeless tobacco use includes chew. He reports current alcohol  use of about 1.0 - 2.0 standard drink of alcohol per week. He reports that he does not use drugs.   Physical Exam: BP 118/72   Pulse 78   Ht 6' (1.829 m)   Wt 251 lb (113.9 kg)  BMI 34.04 kg/m   Constitutional:  Well nourished. Alert and oriented, No acute distress. HEENT: [place] AT, moist mucus membranes.  Trachea midline Cardiovascular: No clubbing, cyanosis, or edema. Respiratory: Normal respiratory effort, no increased work of breathing. Neurologic: Grossly intact, no focal deficits, moving all 4 extremities. Psychiatric: Normal mood and affect.   Laboratory Data: N/A  Pertinent Imaging: N/A  Assessment & Plan:    1. Prostate cancer -PSA undetectable  2. ED -not at goal with sildenafil, but other treatments are cost prohibitive -continue the sildenafil 100 mg, on-demand-dosing  3. Urgency -at goal with tamsulosin 0.4 mg daily  Return in about 6 months (around 05/04/2023) for IPSS, SHIM, PSA .  Hinton Luellen, [place] [address], [place], [place] [address] [phone]

## 2022-11-02 ENCOUNTER — Encounter: Payer: Self-pay | Admitting: Urology

## 2022-11-02 ENCOUNTER — Ambulatory Visit (INDEPENDENT_AMBULATORY_CARE_PROVIDER_SITE_OTHER): Payer: Medicare Other | Admitting: Urology

## 2022-11-02 VITALS — BP 118/72 | HR 78 | Ht 72.0 in | Wt 251.0 lb

## 2022-11-02 DIAGNOSIS — N529 Male erectile dysfunction, unspecified: Secondary | ICD-10-CM

## 2022-11-02 DIAGNOSIS — R3915 Urgency of urination: Secondary | ICD-10-CM

## 2022-11-02 DIAGNOSIS — Z8546 Personal history of malignant neoplasm of prostate: Secondary | ICD-10-CM

## 2022-11-02 DIAGNOSIS — C61 Malignant neoplasm of prostate: Secondary | ICD-10-CM

## 2022-11-02 LAB — BLADDER SCAN AMB NON-IMAGING: Scan Result: 0

## 2022-11-02 MED ORDER — SILDENAFIL CITRATE 100 MG PO TABS: 100.0000 mg | ORAL_TABLET | Freq: Every day | ORAL | 1 refills | Status: DC | PRN

## 2022-11-02 MED ORDER — SILDENAFIL CITRATE 100 MG PO TABS: ORAL_TABLET | ORAL | 12 refills | Status: DC

## 2023-04-30 NOTE — Progress Notes (Unsigned)
11/02/22 11:41 AM   Jack Welch [birth year] [id number]  Referring provider:  No referring provider defined for this encounter.  Urological history:  1. Prostate cancer  - PSA pending  - high risk prostate cancer  - external beam radiation and brachytherapy (12/22/2019), as well as 1.5 years of ADT   2. LUTS -tamsulosin 0.4 mg daily   3. ED  -Contributing factors of age, prostate cancer, pelvic radiation, history of smoking and COPD  -Sildenafil 100 mg, on-demand dosing  4. Renal cysts  -contrast CT, 2020 - Bilateral renal cysts are evident, measuring up to 3.5 cm in the right kidney and 3.3 cm in the left kidney   Chief Complaint  Patient presents with   Erectile Dysfunction      HPI: Jack Welch is a 77 y.o.male who presents today for 6 month follow up.  I PSS ***  ***   IPSS     Row Name 11/02/22 1100         International Prostate Symptom Score   How often have you had the sensation of not emptying your bladder? Less than 1 in 5     How often have you had to urinate less than every two hours? Not at All     How often have you found you stopped and started again several times when you urinated? Less than 1 in 5 times     How often have you found it difficult to postpone urination? Less than 1 in 5 times     How often have you had a weak urinary stream? Less than 1 in 5 times     How often have you had to strain to start urination? Less than 1 in 5 times     How many times did you typically get up at night to urinate? 1 Time     Total IPSS Score 6       Quality of Life due to urinary symptoms   If you were to spend the rest of your life with your urinary condition just the way it is now how would you feel about that? Pleased              Score:  1-7 Mild 8-19 Moderate 20-35 Severe    SHIM ***  ***  SHIM     Row Name 11/02/22 1129         SHIM: Over the last 6 months:   How do you rate your confidence that you could get and keep an  erection? Very Low     When you had erections with sexual stimulation, how often were your erections hard enough for penetration (entering your partner)? Almost Never or Never     During sexual intercourse, how often were you able to maintain your erection after you had penetrated (entered) your partner? Almost Never or Never     During sexual intercourse, how difficult was it to maintain your erection to completion of intercourse? Extremely Difficult     When you attempted sexual intercourse, how often was it satisfactory for you? Almost Never or Never       SHIM Total Score   SHIM 5              Score: 1-7 Severe ED 8-11 Moderate ED 12-16 Mild-Moderate ED 17-21 Mild ED 22-25 No ED    PMH: Past Medical History:  Diagnosis Date   Asthma    Cancer (HCC)    Prostate  Surgical History: Past Surgical History:  Procedure Laterality Date   CATARACT EXTRACTION Right 1974   CYSTOSCOPY  12/22/2019   Procedure: CYSTOSCOPY;  Surgeon: Sondra Come, MD;  Location: [facility] ORS;  Service: Urology;;   none     RADIOACTIVE SEED IMPLANT N/A 12/22/2019   Procedure: RADIOACTIVE SEED IMPLANT/BRACHYTHERAPY IMPLANT;  Surgeon: Sondra Come, MD;  Location: [facility] ORS;  Service: Urology;  Laterality: N/A;    Home Medications:  Allergies as of 11/02/2022   No Known Allergies      Medication List        Accurate as of November 02, 2022 11:41 AM. If you have any questions, ask your nurse or doctor.          albuterol 108 (90 Base) MCG/ACT inhaler Commonly known as: VENTOLIN HFA Inhale 2 puffs into the lungs every 4 (four) hours as needed for wheezing or shortness of breath.   AMBULATORY NON FORMULARY MEDICATION Trimix (30/1/10)-(Pap/Phent/PGE)  Test Dose  1ml vial   Qty #3 Refills 0  [facility] [phone] Fax [phone]   Flovent HFA 220 MCG/ACT inhaler Generic drug: fluticasone Inhale 2 puffs into the lungs in the morning and at bedtime.   fluticasone  50 MCG/ACT nasal spray Commonly known as: FLONASE Place 2 sprays into both nostrils daily as needed (allergies.).   sildenafil 100 MG tablet Commonly known as: VIAGRA Take 1 tablet (100 mg total) by mouth daily as needed for erectile dysfunction. Take two hours prior to intercourse on an empty stomach What changed: Another medication with the same name was added. Make sure you understand how and when to take each. Changed by: Michiel Cowboy, PA-C   sildenafil 100 MG tablet Commonly known as: VIAGRA Take two hours prior to intercourse on an empty stomach  He wants BRAND NAME VIAGRA What changed: You were already taking a medication with the same name, and this prescription was added. Make sure you understand how and when to take each. Changed by: Michiel Cowboy, PA-C   tadalafil 20 MG tablet Commonly known as: CIALIS Take 1 tablet (20 mg total) by mouth every other day.   tamsulosin 0.4 MG Caps capsule Commonly known as: FLOMAX Take 1 capsule (0.4 mg total) by mouth daily after supper.   Trelegy Ellipta 100-62.5-25 MCG/ACT Aepb Generic drug: Fluticasone-Umeclidin-Vilant Inhale 1 puff into the lungs daily.        Allergies:  No Known Allergies  Family History: Family History  Problem Relation Age of Onset   Prostate cancer Neg Hx    Bladder Cancer Neg Hx    Kidney cancer Neg Hx     Social History:  reports that he quit smoking about 9 years ago. His smoking use included cigarettes. He has a 36.00 pack-year smoking history. He has been exposed to tobacco smoke. His smokeless tobacco use includes chew. He reports current alcohol use of about 1.0 - 2.0 standard drink of alcohol per week. He reports that he does not use drugs.   Physical Exam: BP 118/72   Pulse 78   Ht 6' (1.829 m)   Wt 251 lb (113.9 kg)   BMI 34.04 kg/m   Constitutional:  Well nourished. Alert and oriented, No acute distress. HEENT: [place] AT, moist mucus membranes.  Trachea midline Cardiovascular:  No clubbing, cyanosis, or edema. Respiratory: Normal respiratory effort, no increased work of breathing. GU: No CVA tenderness.  No bladder fullness or masses.  Patient with circumcised/uncircumcised phallus. ***Foreskin easily retracted***  Urethral meatus is  patent.  No penile discharge. No penile lesions or rashes. Scrotum without lesions, cysts, rashes and/or edema.  Testicles are located scrotally bilaterally. No masses are appreciated in the testicles. Left and right epididymis are normal. Rectal: Patient with  normal sphincter tone. Anus and perineum without scarring or rashes. No rectal masses are appreciated. Prostate is approximately *** grams, *** nodules are appreciated. Seminal vesicles are normal. Neurologic: Grossly intact, no focal deficits, moving all 4 extremities. Psychiatric: Normal mood and affect.   Laboratory Data: pending  Pertinent Imaging: N/A  Assessment & Plan:    1. Prostate cancer -PSA undetectable  2. ED -not at goal with sildenafil, but other treatments are cost prohibitive -continue the sildenafil 100 mg, on-demand-dosing  3. Urgency -at goal with tamsulosin 0.4 mg daily  Return in about 6 months (around 05/04/2023) for IPSS, SHIM, PSA .  Cloretta Ned   [facility] [address] [place], [place] [address] [phone]

## 2023-05-01 ENCOUNTER — Ambulatory Visit (INDEPENDENT_AMBULATORY_CARE_PROVIDER_SITE_OTHER): Payer: 59 | Admitting: Urology

## 2023-05-01 ENCOUNTER — Encounter: Payer: Self-pay | Admitting: Urology

## 2023-05-01 VITALS — BP 112/71 | HR 77 | Ht 72.0 in | Wt 263.0 lb

## 2023-05-01 DIAGNOSIS — R3915 Urgency of urination: Secondary | ICD-10-CM | POA: Diagnosis not present

## 2023-05-01 DIAGNOSIS — C61 Malignant neoplasm of prostate: Secondary | ICD-10-CM | POA: Diagnosis not present

## 2023-05-01 DIAGNOSIS — N529 Male erectile dysfunction, unspecified: Secondary | ICD-10-CM

## 2023-05-01 MED ORDER — SILDENAFIL CITRATE 100 MG PO TABS
100.0000 mg | ORAL_TABLET | Freq: Every day | ORAL | 11 refills | Status: DC | PRN
Start: 2023-05-01 — End: 2024-05-21

## 2023-05-01 MED ORDER — TAMSULOSIN HCL 0.4 MG PO CAPS: 0.4000 mg | ORAL_CAPSULE | Freq: Every day | ORAL | 11 refills | Status: DC

## 2023-05-02 ENCOUNTER — Telehealth: Payer: Self-pay | Admitting: Family Medicine

## 2023-05-02 LAB — PSA: Prostate Specific Ag, Serum: <0.1 ng/mL (ref 0.0–4.0)

## 2023-05-02 NOTE — Telephone Encounter (Signed)
-----   Message from Harle Battiest, PA-C sent at 05/02/2023 10:14 AM EDT ----- Please let Jack Welch know that his PSA remains undetectable.

## 2023-05-02 NOTE — Telephone Encounter (Signed)
Patient notified and voiced understanding.

## 2023-05-04 ENCOUNTER — Inpatient Hospital Stay: Payer: 59 | Attending: Radiation Oncology

## 2023-05-04 DIAGNOSIS — C61 Malignant neoplasm of prostate: Secondary | ICD-10-CM | POA: Diagnosis not present

## 2023-05-04 LAB — PSA: Prostatic Specific Antigen: 0.01 ng/mL (ref 0.00–4.00)

## 2023-05-10 ENCOUNTER — Encounter: Payer: Self-pay | Admitting: Radiation Oncology

## 2023-05-10 ENCOUNTER — Ambulatory Visit
Admission: RE | Admit: 2023-05-10 | Discharge: 2023-05-10 | Disposition: A | Discharge status: Home / Self Care | Payer: 59 | Source: Ambulatory Visit | Attending: Radiation Oncology | Admitting: Radiation Oncology

## 2023-05-10 VITALS — BP 146/69 | HR 86 | Temp 98.6°F | Resp 20 | Ht 72.0 in | Wt 265.0 lb

## 2023-05-10 DIAGNOSIS — C61 Malignant neoplasm of prostate: Secondary | ICD-10-CM | POA: Diagnosis present

## 2023-05-10 DIAGNOSIS — Z923 Personal history of irradiation: Secondary | ICD-10-CM | POA: Diagnosis not present

## 2023-05-10 DIAGNOSIS — C775 Secondary and unspecified malignant neoplasm of intrapelvic lymph nodes: Secondary | ICD-10-CM | POA: Diagnosis not present

## 2023-05-10 NOTE — Progress Notes (Signed)
Radiation Oncology Follow up Note  Name: Jack Welch   Date:   05/10/2023 MRN:  [id number] DOB: [birth year]    This 77 y.o. male presents to the clinic today for 3 and half year follow-up status post I-125 interstitial implant for boost as well as patient treated with external beam to his prostate and pelvic nodes for stage IIb Gleason 8 (4+4) adenocarcinoma presenting with a PSA of 22.5.Marland Kitchen  REFERRING PROVIDER: [name],*  HPI: Patient is a 77 year old male now out over 3 and half years having completed both external beam treatment as well as interstitial implant for Gleason 8 adenocarcinoma presenting with a PSA of 22.5.  Seen today in routine follow-up he is doing well.  He specifically denies any increased lower urinary tract symptoms diarrhea or fatigue.  His most recent PSA is 0.01.  He is no longer on ADT therapy..  COMPLICATIONS OF TREATMENT: none  FOLLOW UP COMPLIANCE: keeps appointments   PHYSICAL EXAM:  BP (!) 146/69   Pulse 86   Temp 98.6 F (37 C) (Tympanic)   Resp 20   Ht 6' (1.829 m) Comment: Stated HT  Wt 265 lb (120.2 kg)   BMI 35.94 kg/m  Well-developed well-nourished patient in NAD. HEENT reveals PERLA, EOMI, discs not visualized.  Oral cavity is clear. No oral mucosal lesions are identified. Neck is clear without evidence of cervical or supraclavicular adenopathy. Lungs are clear to A&P. Cardiac examination is essentially unremarkable with regular rate and rhythm without murmur rub or thrill. Abdomen is benign with no organomegaly or masses noted. Motor sensory and DTR levels are equal and symmetric in the upper and lower extremities. Cranial nerves II through XII are grossly intact. Proprioception is intact. No peripheral adenopathy or edema is identified. No motor or sensory levels are noted. Crude visual fields are within normal range.  RADIOLOGY RESULTS: No current films for review  PLAN: Time I am going to turn follow-up care over to urology.  I  will be happy to reevaluate this patient in time should that be indicated.  He is now out over 3-1/2 years with no evidence of biochemical recurrence.  I am pleased with his overall progress.  Patient knows to call with any concerns.  I would like to take this opportunity to thank you for allowing me to participate in the care of your patient.[name], MD

## 2023-05-11 ENCOUNTER — Ambulatory Visit: Payer: Medicare Other | Admitting: Radiation Oncology

## 2023-07-13 ENCOUNTER — Other Ambulatory Visit: Payer: Self-pay | Admitting: Internal Medicine

## 2023-07-13 DIAGNOSIS — J449 Chronic obstructive pulmonary disease, unspecified: Secondary | ICD-10-CM

## 2023-07-20 ENCOUNTER — Ambulatory Visit
Admission: RE | Admit: 2023-07-20 | Discharge: 2023-07-20 | Disposition: A | Discharge status: Home / Self Care | Payer: 59 | Source: Ambulatory Visit | Attending: Internal Medicine | Admitting: Internal Medicine

## 2023-07-20 DIAGNOSIS — J449 Chronic obstructive pulmonary disease, unspecified: Secondary | ICD-10-CM

## 2023-09-17 ENCOUNTER — Other Ambulatory Visit: Payer: Self-pay | Admitting: Internal Medicine

## 2023-09-17 DIAGNOSIS — E041 Nontoxic single thyroid nodule: Secondary | ICD-10-CM

## 2023-09-20 ENCOUNTER — Ambulatory Visit
Admission: RE | Admit: 2023-09-20 | Discharge: 2023-09-20 | Disposition: A | Discharge status: Home / Self Care | Payer: 59 | Source: Ambulatory Visit | Attending: Internal Medicine | Admitting: Internal Medicine

## 2023-09-20 DIAGNOSIS — E041 Nontoxic single thyroid nodule: Secondary | ICD-10-CM | POA: Diagnosis present

## 2023-10-29 NOTE — Progress Notes (Unsigned)
10/31/23 11:14 AM   Jack Welch November 19, 1946 [id number]  Referring provider:  [name], MD [address] [place],  [place] [address]  Urological history:  1. Prostate cancer  - PSA pending  - high risk prostate cancer  - external beam radiation and brachytherapy (12/22/2019), as well as 1.5 years of ADT   2. LUTS -tamsulosin 0.4 mg daily   3. ED  -Contributing factors of age, prostate cancer, pelvic radiation, history of smoking and COPD  -Sildenafil 100 mg, on-demand dosing  4. Renal cysts  -contrast CT, 2020 - Bilateral renal cysts are evident, measuring up to 3.5 cm in the right kidney and 3.3 cm in the left kidney   Chief Complaint  Patient presents with   Prostate Cancer    HPI: Jack Welch is a 77 y.o.male who presents today for 6 month follow up.  Previous records reviewed.   I PSS 19/0  He is having urinary frequency.  Patient denies any modifying or aggravating factors.  Patient denies any recent UTI's, gross hematuria, dysuria or suprapubic/flank pain.  Patient denies any fevers, chills, nausea or vomiting.     IPSS     Row Name 10/31/23 1000         International Prostate Symptom Score   How often have you had the sensation of not emptying your bladder? About half the time     How often have you had to urinate less than every two hours? Less than half the time     How often have you found you stopped and started again several times when you urinated? More than half the time     How often have you found it difficult to postpone urination? Almost always     How often have you had a weak urinary stream? Almost always     How often have you had to strain to start urination? Not at All     How many times did you typically get up at night to urinate? None     Total IPSS Score 19               Score:  1-7 Mild 8-19 Moderate 20-35 Severe    SHIM 6  Sildenafil 100 mg on-demand-dosing is not effective.   He cannot afford ICI  and does not want to have surgery.  Patient is not having spontaneous erections.  He denies any pain or curvature with erections.     SHIM     Row Name 10/31/23 1044         SHIM: Over the last 6 months:   How do you rate your confidence that you could get and keep an erection? Very Low     When you had erections with sexual stimulation, how often were your erections hard enough for penetration (entering your partner)? A Few Times (much less than half the time)     During sexual intercourse, how often were you able to maintain your erection after you had penetrated (entered) your partner? No Sexual Activity     During sexual intercourse, how difficult was it to maintain your erection to completion of intercourse? Very Difficult     When you attempted sexual intercourse, how often was it satisfactory for you? Almost Never or Never       SHIM Total Score   SHIM 6              Score: 1-7 Severe ED 8-11 Moderate ED 12-16 Mild-Moderate  ED 17-21 Mild ED 22-25 No ED   PMH: Past Medical History:  Diagnosis Date   Asthma    Cancer [facility])    Prostate    Surgical History: Past Surgical History:  Procedure Laterality Date   CATARACT EXTRACTION Right 1974   CYSTOSCOPY  12/22/2019   Procedure: CYSTOSCOPY;  Surgeon: Sondra Come, MD;  Location: [facility] ORS;  Service: Urology;;   none     RADIOACTIVE SEED IMPLANT N/A 12/22/2019   Procedure: RADIOACTIVE SEED IMPLANT/BRACHYTHERAPY IMPLANT;  Surgeon: Sondra Come, MD;  Location: [facility] ORS;  Service: Urology;  Laterality: N/A;    Home Medications:  Allergies as of 10/31/2023   No Known Allergies      Medication List        Accurate as of October 31, 2023 11:14 AM. If you have any questions, ask your nurse or doctor.          albuterol 108 (90 Base) MCG/ACT inhaler Commonly known as: VENTOLIN HFA Inhale 2 puffs into the lungs every 4 (four) hours as needed for wheezing or shortness of breath.   atorvastatin 10 MG  tablet Commonly known as: LIPITOR Take 10 mg by mouth daily.   Flovent HFA 220 MCG/ACT inhaler Generic drug: fluticasone Inhale 2 puffs into the lungs in the morning and at bedtime.   fluticasone 50 MCG/ACT nasal spray Commonly known as: FLONASE Place 2 sprays into both nostrils daily as needed (allergies.).   omeprazole 20 MG capsule Commonly known as: PRILOSEC Take 20 mg by mouth daily.   sildenafil 100 MG tablet Commonly known as: VIAGRA Take 1 tablet (100 mg total) by mouth daily as needed for erectile dysfunction. Take two hours prior to intercourse on an empty stomach   tamsulosin 0.4 MG Caps capsule Commonly known as: FLOMAX Take 1 capsule (0.4 mg total) by mouth daily after supper.   Trelegy Ellipta 100-62.5-25 MCG/INH Aepb Generic drug: Fluticasone-Umeclidin-Vilant Inhale 1 puff into the lungs daily.        Allergies:  No Known Allergies  Family History: Family History  Problem Relation Age of Onset   Prostate cancer Neg Hx    Bladder Cancer Neg Hx    Kidney cancer Neg Hx     Social History:  reports that he quit smoking about 10 years ago. His smoking use included cigarettes. He started smoking about 58 years ago. He has a 36 pack-year smoking history. He has been exposed to tobacco smoke. His smokeless tobacco use includes chew. He reports current alcohol use of about 1.0 - 2.0 standard drink of alcohol per week. He reports that he does not use drugs.   Physical Exam: BP 133/77   Pulse 72   Ht 6' (1.829 m)   Wt 262 lb 3.2 oz (118.9 kg)   BMI 35.56 kg/m   Constitutional:  Well nourished. Alert and oriented, No acute distress. HEENT: [place] AT, moist mucus membranes.  Trachea midline Cardiovascular: No clubbing, cyanosis, or edema. Respiratory: Normal respiratory effort, no increased work of breathing. Neurologic: Grossly intact, no focal deficits, moving all 4 extremities. Psychiatric: Normal mood and affect.   Laboratory Data: Thyroid  Stimulating-Hormone (TSH) Order: [id number] Component Ref Range & Units 5 d ago  Thyroid Stimulating Hormone (TSH) 0.450-5.330 uIU/ml uIU/mL 9.279 High   Resulting Agency [facility] - LAB   Specimen Collected: 10/24/23 14:00   Performed by: [facility] Last Resulted: 10/24/23 16:04  Received From: [facility]  Result Received: 10/29/23  12:06   pending  Pertinent Imaging: N/A  Assessment & Plan:    1. Prostate cancer -PSA pending   2. ED -not at goal with sildenafil, but other treatments are cost prohibitive -continue the sildenafil 100 mg, on-demand-dosing  3. Urgency -at goal with tamsulosin 0.4 mg daily -Continue tamsulosin 0.4 mg daily  Return in about 6 months (around 04/30/2024) for PSA, I PSS, SHIM .  Cloretta Ned   [facility] [address] [place], [place] [address] [phone]

## 2023-10-31 ENCOUNTER — Encounter: Payer: Self-pay | Admitting: Urology

## 2023-10-31 ENCOUNTER — Ambulatory Visit (INDEPENDENT_AMBULATORY_CARE_PROVIDER_SITE_OTHER): Payer: 59 | Admitting: Urology

## 2023-10-31 VITALS — BP 133/77 | HR 72 | Ht 72.0 in | Wt 262.2 lb

## 2023-10-31 DIAGNOSIS — C61 Malignant neoplasm of prostate: Secondary | ICD-10-CM

## 2023-10-31 DIAGNOSIS — N529 Male erectile dysfunction, unspecified: Secondary | ICD-10-CM | POA: Diagnosis not present

## 2023-10-31 DIAGNOSIS — R3915 Urgency of urination: Secondary | ICD-10-CM | POA: Diagnosis not present

## 2023-11-01 ENCOUNTER — Other Ambulatory Visit: Payer: Self-pay | Admitting: Urology

## 2023-11-01 DIAGNOSIS — C61 Malignant neoplasm of prostate: Secondary | ICD-10-CM

## 2023-11-01 LAB — PSA: Prostate Specific Ag, Serum: <0.1 ng/mL (ref 0.0–4.0)

## 2024-04-29 NOTE — Progress Notes (Deleted)
 04/29/24 12:46 PM   Cherylyn Cos November 07, 1946 [id number]  Referring provider:  Benuel Brazier, MD [address] [place],  [place] [address]  Urological history:  1. Prostate cancer  - PSA pending  - high risk prostate cancer  - external beam radiation and brachytherapy (12/22/2019), as well as 1.5 years of ADT   2. LUTS -tamsulosin  0.4 mg daily   3. ED  -Contributing factors of age, prostate cancer, pelvic radiation, history of smoking and COPD  -Sildenafil  100 mg, on-demand dosing  4. Renal cysts  -contrast CT, 2020 - Bilateral renal cysts are evident, measuring up to 3.5 cm in the right kidney and 3.3 cm in the left kidney   No chief complaint on file.   HPI: Jack Welch is a 78 y.o.male who presents today for 6 month follow up.  Previous records reviewed.   I PSS ***      Score:  1-7 Mild 8-19 Moderate 20-35 Severe    SHIM ***      Score: 1-7 Severe ED 8-11 Moderate ED 12-16 Mild-Moderate ED 17-21 Mild ED 22-25 No ED   PMH: Past Medical History:  Diagnosis Date   Asthma    Cancer [facility])    Prostate    Surgical History: Past Surgical History:  Procedure Laterality Date   CATARACT EXTRACTION Right 1974   CYSTOSCOPY  12/22/2019   Procedure: CYSTOSCOPY;  Surgeon: Lawerence Pressman, MD;  Location: [facility] ORS;  Service: Urology;;   none     RADIOACTIVE SEED IMPLANT N/A 12/22/2019   Procedure: RADIOACTIVE SEED IMPLANT/BRACHYTHERAPY IMPLANT;  Surgeon: Lawerence Pressman, MD;  Location: [facility] ORS;  Service: Urology;  Laterality: N/A;    Home Medications:  Allergies as of 04/30/2024   No Known Allergies      Medication List        Accurate as of April 29, 2024 12:46 PM. If you have any questions, ask your nurse or doctor.          albuterol  108 (90 Base) MCG/ACT inhaler Commonly known as: VENTOLIN  HFA Inhale 2 puffs into the lungs every 4 (four) hours as needed for wheezing or shortness of breath.   atorvastatin 10 MG  tablet Commonly known as: LIPITOR Take 10 mg by mouth daily.   Flovent  HFA 220 MCG/ACT inhaler Generic drug: fluticasone  Inhale 2 puffs into the lungs in the morning and at bedtime.   fluticasone  50 MCG/ACT nasal spray Commonly known as: FLONASE Place 2 sprays into both nostrils daily as needed (allergies.).   omeprazole 20 MG capsule Commonly known as: PRILOSEC Take 20 mg by mouth daily.   sildenafil  100 MG tablet Commonly known as: VIAGRA  Take 1 tablet (100 mg total) by mouth daily as needed for erectile dysfunction. Take two hours prior to intercourse on an empty stomach   tamsulosin  0.4 MG Caps capsule Commonly known as: FLOMAX  Take 1 capsule (0.4 mg total) by mouth daily after supper.   Trelegy Ellipta  100-62.5-25 MCG/INH Aepb Generic drug: Fluticasone -Umeclidin-Vilant Inhale 1 puff into the lungs daily.        Allergies:  No Known Allergies  Family History: Family History  Problem Relation Age of Onset   Prostate cancer Neg Hx    Bladder Cancer Neg Hx    Kidney cancer Neg Hx     Social History:  reports that he quit smoking about 11 years ago. His smoking use included cigarettes. He started smoking about 59 years ago. He has a 36 pack-year smoking  history. He has been exposed to tobacco smoke. His smokeless tobacco use includes chew. He reports current alcohol use of about 1.0 - 2.0 standard drink of alcohol per week. He reports that he does not use drugs.   Physical Exam: There were no vitals taken for this visit.  Constitutional:  Well nourished. Alert and oriented, No acute distress. HEENT: [place] AT, moist mucus membranes.  Trachea midline, no masses. Cardiovascular: No clubbing, cyanosis, or edema. Respiratory: Normal respiratory effort, no increased work of breathing. GI: Abdomen is soft, non tender, non distended, no abdominal masses. Liver and spleen not palpable.  No hernias appreciated.  Stool sample for occult testing is not indicated.   GU: No CVA  tenderness.  No bladder fullness or masses.  Patient with circumcised/uncircumcised phallus. ***Foreskin easily retracted***  Urethral meatus is patent.  No penile discharge. No penile lesions or rashes. Scrotum without lesions, cysts, rashes and/or edema.  Testicles are located scrotally bilaterally. No masses are appreciated in the testicles. Left and right epididymis are normal. Rectal: Patient with  normal sphincter tone. Anus and perineum without scarring or rashes. No rectal masses are appreciated. Prostate is approximately *** grams, *** nodules are appreciated. Seminal vesicles are normal. Skin: No rashes, bruises or suspicious lesions. Lymph: No cervical or inguinal adenopathy. Neurologic: Grossly intact, no focal deficits, moving all 4 extremities. Psychiatric: Normal mood and affect.   Laboratory Data: PSA pending   Pertinent Imaging: N/A  Assessment & Plan:    1. Prostate cancer -PSA pending   2. ED - ***  3. Urgency - ***  No follow-ups on file.  Briant Camper   [facility] [address], [address] [place], [place] [address] [phone]

## 2024-04-30 ENCOUNTER — Ambulatory Visit: Payer: Self-pay | Admitting: Urology

## 2024-04-30 DIAGNOSIS — N529 Male erectile dysfunction, unspecified: Secondary | ICD-10-CM

## 2024-04-30 DIAGNOSIS — R3915 Urgency of urination: Secondary | ICD-10-CM

## 2024-04-30 DIAGNOSIS — C61 Malignant neoplasm of prostate: Secondary | ICD-10-CM

## 2024-05-21 ENCOUNTER — Ambulatory Visit: Admitting: Urology

## 2024-05-21 ENCOUNTER — Encounter: Payer: Self-pay | Admitting: Urology

## 2024-05-21 VITALS — BP 127/54 | HR 64 | Ht 72.0 in | Wt 260.0 lb

## 2024-05-21 DIAGNOSIS — N529 Male erectile dysfunction, unspecified: Secondary | ICD-10-CM | POA: Diagnosis not present

## 2024-05-21 DIAGNOSIS — R3915 Urgency of urination: Secondary | ICD-10-CM | POA: Diagnosis not present

## 2024-05-21 DIAGNOSIS — C61 Malignant neoplasm of prostate: Secondary | ICD-10-CM

## 2024-05-21 MED ORDER — TAMSULOSIN HCL 0.4 MG PO CAPS: 0.4000 mg | ORAL_CAPSULE | Freq: Every day | ORAL | 11 refills | Status: AC

## 2024-05-21 MED ORDER — SILDENAFIL CITRATE 100 MG PO TABS: 100.0000 mg | ORAL_TABLET | Freq: Every day | ORAL | 11 refills | Status: DC | PRN

## 2024-05-21 NOTE — Progress Notes (Signed)
 05/26/24 2:58 PM   Jack Welch 11-12-1946 [id number]  Referring provider:  Sampson Welch LABOR, MD [address] [place],  [place] [address]  Urological history:  1. Prostate cancer  - PSA pending  - high risk prostate cancer  - external beam radiation and brachytherapy (12/22/2019), as well as 1.5 years of ADT   2. LUTS -tamsulosin  0.4 mg daily   3. ED  -Sildenafil  100 mg, on-demand dosing  4. Renal cysts  -contrast CT, 2020 - Bilateral renal cysts are evident, measuring up to 3.5 cm in the right kidney and 3.3 cm in the left kidney   Chief Complaint  Patient presents with   Prostate Cancer    PSA, I PSS - follow up in 6 months    HPI: Jack Welch is a 78 y.o.male who presents today for 6 month follow up.  Previous records reviewed.   He states that Viagra  is working satisfactorily for him.  He denies any pain or curvature with erections.  He no longer has spontaneous erections.  I PSS 9/2  He is doing well with the tamsulosin  0.4 mg daily.  He does not have any bothersome urinary symptoms.  Patient denies any modifying or aggravating factors.  Patient denies any recent UTI's, gross hematuria, dysuria or suprapubic/flank pain.  Patient denies any fevers, chills, nausea or vomiting.     IPSS     Row Name 05/21/24 0900         International Prostate Symptom Score   How often have you had the sensation of not emptying your bladder? Less than half the time     How often have you had to urinate less than every two hours? Not at All     How often have you found you stopped and started again several times when you urinated? Not at All     How often have you found it difficult to postpone urination? Not at All     How often have you had a weak urinary stream? Not at All     How often have you had to strain to start urination? Not at All     How many times did you typically get up at night to urinate? None     Total IPSS Score 2       Quality of Life due to  urinary symptoms   If you were to spend the rest of your life with your urinary condition just the way it is now how would you feel about that? Mostly Satisfied       Score:  1-7 Mild 8-19 Moderate 20-35 Severe   PMH: Past Medical History:  Diagnosis Date   Asthma    Cancer [facility])    Prostate    Surgical History: Past Surgical History:  Procedure Laterality Date   CATARACT EXTRACTION Right 1974   CYSTOSCOPY  12/22/2019   Procedure: CYSTOSCOPY;  Surgeon: Jack Redell JAYSON, MD;  Location: [facility] ORS;  Service: Urology;;   none     RADIOACTIVE SEED IMPLANT N/A 12/22/2019   Procedure: RADIOACTIVE SEED IMPLANT/BRACHYTHERAPY IMPLANT;  Surgeon: Jack Redell JAYSON, MD;  Location: [facility] ORS;  Service: Urology;  Laterality: N/A;    Home Medications:  Allergies as of 05/21/2024   No Known Allergies      Medication List        Accurate as of May 21, 2024 11:59 PM. If you have any questions, ask your nurse or doctor.  albuterol  108 (90 Base) MCG/ACT inhaler Commonly known as: VENTOLIN  HFA Inhale 2 puffs into the lungs every 4 (four) hours as needed for wheezing or shortness of breath.   atorvastatin 10 MG tablet Commonly known as: LIPITOR Take 10 mg by mouth daily.   Flovent  HFA 220 MCG/ACT inhaler Generic drug: fluticasone  Inhale 2 puffs into the lungs in the morning and at bedtime.   fluticasone  50 MCG/ACT nasal spray Commonly known as: FLONASE Place 2 sprays into both nostrils daily as needed (allergies.).   omeprazole 20 MG capsule Commonly known as: PRILOSEC Take 20 mg by mouth daily.   sildenafil  100 MG tablet Commonly known as: VIAGRA  Take 1 tablet (100 mg total) by mouth daily as needed for erectile dysfunction. Take two hours prior to intercourse on an empty stomach   tamsulosin  0.4 MG Caps capsule Commonly known as: FLOMAX  Take 1 capsule (0.4 mg total) by mouth daily after supper.   Trelegy Ellipta  100-62.5-25 MCG/INH Aepb Generic drug:  Fluticasone -Umeclidin-Vilant Inhale 1 puff into the lungs daily.        Allergies:  No Known Allergies  Family History: Family History  Problem Relation Age of Onset   Prostate cancer Neg Hx    Bladder Cancer Neg Hx    Kidney cancer Neg Hx     Social History:  reports that he quit smoking about 11 years ago. His smoking use included cigarettes. He started smoking about 59 years ago. He has a 36 pack-year smoking history. He has been exposed to tobacco smoke. His smokeless tobacco use includes chew. He reports current alcohol use of about 1.0 - 2.0 standard drink of alcohol per week. He reports that he does not use drugs.   Physical Exam: BP (!) 127/54   Pulse 64   Ht 6' (1.829 m)   Wt 260 lb (117.9 kg)   BMI 35.26 kg/m   Constitutional:  Well nourished. Alert and oriented, No acute distress. HEENT:  AT, moist mucus membranes.  Trachea midline Cardiovascular: No clubbing, cyanosis, or edema. Respiratory: Normal respiratory effort, no increased work of breathing. GU: No CVA tenderness.  No bladder fullness or masses.  Patient with circumcised  phallus.  Urethral meatus is patent.  No penile discharge. No penile lesions or rashes. Scrotum without lesions, cysts, rashes and/or edema.  Testicles are located scrotally bilaterally and they are atrophic.  No masses are appreciated in the testicles. Left and right epididymis are normal. Rectal: Patient with  normal sphincter tone. Anus and perineum without scarring or rashes. No rectal masses are appreciated. Prostate is approximately 35 grams, could not palpate the entire gland, no nodules are appreciated. Seminal vesicles could not be palpated. Neurologic: Grossly intact, no focal deficits, moving all 4 extremities. Psychiatric: Normal mood and affect.   Laboratory Data: PSA pending   Pertinent Imaging: N/A  Assessment & Plan:    1. Prostate cancer -PSA pending  - If it remains at nadir, we will continue to follow  biannually  2. ED - Continue sildenafil  100 mg, on-demand dosing  3. Urgency - mild and not bothersome  - Continue tamsulosin  0.4 mg daily  Return in about 6 months (around 11/21/2024) for PSA and symptom recheck .  Jack Welch   [facility] [address], [address] [place], [place] [address] [phone]

## 2024-05-22 ENCOUNTER — Ambulatory Visit: Payer: Self-pay | Admitting: Urology

## 2024-05-22 LAB — PSA: Prostate Specific Ag, Serum: <0.1 ng/mL (ref 0.0–4.0)

## 2024-06-03 ENCOUNTER — Other Ambulatory Visit: Payer: Self-pay | Admitting: Urology

## 2024-06-03 DIAGNOSIS — N529 Male erectile dysfunction, unspecified: Secondary | ICD-10-CM

## 2024-07-03 ENCOUNTER — Other Ambulatory Visit: Payer: Self-pay | Admitting: Specialist

## 2024-07-03 DIAGNOSIS — Z87891 Personal history of nicotine dependence: Secondary | ICD-10-CM

## 2024-07-03 DIAGNOSIS — J449 Chronic obstructive pulmonary disease, unspecified: Secondary | ICD-10-CM

## 2024-08-06 ENCOUNTER — Ambulatory Visit
Admission: RE | Admit: 2024-08-06 | Discharge: 2024-08-06 | Disposition: A | Discharge status: Home / Self Care | Source: Ambulatory Visit | Attending: Specialist | Admitting: Specialist

## 2024-08-06 DIAGNOSIS — J449 Chronic obstructive pulmonary disease, unspecified: Secondary | ICD-10-CM | POA: Diagnosis present

## 2024-08-06 DIAGNOSIS — Z87891 Personal history of nicotine dependence: Secondary | ICD-10-CM | POA: Insufficient documentation

## 2024-09-02 ENCOUNTER — Encounter: Payer: Self-pay | Admitting: Urology

## 2024-11-21 ENCOUNTER — Ambulatory Visit: Admitting: Urology

## 2024-11-24 NOTE — Progress Notes (Signed)
 "   11/25/2024 10:37 AM   Jack Welch 10-13-1946 [id number]  Referring provider:  Sampson Ethridge LABOR, MD [address] [place],  [place] [address]  Urological history:  1. Prostate cancer  - PSA pending  - high risk prostate cancer  - external beam radiation and brachytherapy (12/22/2019), as well as 1.5 years of ADT   2. LUTS -tamsulosin  0.4 mg daily   3. ED  -Sildenafil  100 mg, on-demand dosing  4. Renal cysts  -contrast CT, 2020 - Bilateral renal cysts are evident, measuring up to 3.5 cm in the right kidney and 3.3 cm in the left kidney   Chief Complaint  Patient presents with   Prostate Cancer    HPI: Jack Welch is a 79 y.o.male who presents today for 6 month follow up.  Previous records reviewed.   I PSS 5/1  He reports sensation of incomplete bladder emptying and urinary frequency.  Patient denies any modifying or aggravating factors.  Patient denies any recent UTI's, gross hematuria, dysuria or suprapubic/flank pain.  Patient denies any fevers, chills, nausea or vomiting.    SHIM 12  He does not have confidence that he could get and keep an erection, his erections are not firm enough for penetrative intercourse, he has difficulty maintaining his erections, and he is not finding intercourse satisfactory for him.    Patient is not having spontaneous erections.  He denies any pain or curvature with erections.    He would still like to keep taking the Viagra .    PMH: Past Medical History:  Diagnosis Date   Asthma    Cancer [facility])    Prostate    Surgical History: Past Surgical History:  Procedure Laterality Date   CATARACT EXTRACTION Right 1974   CYSTOSCOPY  12/22/2019   Procedure: CYSTOSCOPY;  Surgeon: Francisca Redell JAYSON, MD;  Location: [facility] ORS;  Service: Urology;;   none     RADIOACTIVE SEED IMPLANT N/A 12/22/2019   Procedure: RADIOACTIVE SEED IMPLANT/BRACHYTHERAPY IMPLANT;  Surgeon: Francisca Redell JAYSON, MD;  Location: [facility] ORS;  Service: Urology;   Laterality: N/A;    Home Medications:  Allergies as of 11/25/2024   No Known Allergies      Medication List        Accurate as of November 25, 2024 10:37 AM. If you have any questions, ask your nurse or doctor.          albuterol  108 (90 Base) MCG/ACT inhaler Commonly known as: VENTOLIN  HFA Inhale 2 puffs into the lungs every 4 (four) hours as needed for wheezing or shortness of breath.   atorvastatin 10 MG tablet Commonly known as: LIPITOR Take 10 mg by mouth daily.   Flovent HFA 220 MCG/ACT inhaler Generic drug: fluticasone Inhale 2 puffs into the lungs in the morning and at bedtime.   fluticasone 50 MCG/ACT nasal spray Commonly known as: FLONASE Place 2 sprays into both nostrils daily as needed (allergies.).   omeprazole 20 MG capsule Commonly known as: PRILOSEC Take 20 mg by mouth daily.   sildenafil  100 MG tablet Commonly known as: VIAGRA  Take 1 tablet (100 mg total) by mouth daily as needed for erectile dysfunction. Take two hours prior to intercourse on an empty stomach   tamsulosin  0.4 MG Caps capsule Commonly known as: FLOMAX  Take 1 capsule (0.4 mg total) by mouth daily after supper. What changed: Another medication with the same name was added. Make sure you understand how and when to take each.   tamsulosin  0.4 MG  Caps capsule Commonly known as: FLOMAX  Take 1 capsule (0.4 mg total) by mouth daily. What changed: You were already taking a medication with the same name, and this prescription was added. Make sure you understand how and when to take each.   Trelegy Ellipta 100-62.5-25 MCG/INH Aepb Generic drug: Fluticasone-Umeclidin-Vilant Inhale 1 puff into the lungs daily.        Allergies:  No Known Allergies  Family History: Family History  Problem Relation Age of Onset   Prostate cancer Neg Hx    Bladder Cancer Neg Hx    Kidney cancer Neg Hx     Social History:  reports that he quit smoking about 12 years ago. His smoking use included  cigarettes. He started smoking about 60 years ago. He has a 36 pack-year smoking history. He has been exposed to tobacco smoke. His smokeless tobacco use includes chew. He reports current alcohol use of about 1.0 - 2.0 standard drink of alcohol per week. He reports that he does not use drugs.   Physical Exam: BP (!) 148/84   Pulse 72   Wt 256 lb (116.1 kg)   SpO2 97%   BMI 34.72 kg/m   Constitutional:  Well nourished. Alert and oriented, No acute distress. HEENT: [place] AT, moist mucus membranes.  Trachea midline Cardiovascular: No clubbing, cyanosis, or edema. Respiratory: Normal respiratory effort, no increased work of breathing. Neurologic: Grossly intact, no focal deficits, moving all 4 extremities. Psychiatric: Normal mood and affect.   Laboratory Data: See Epic and HPI   I have reviewed the labs.  See HPI.     Pertinent Imaging: N/A  Assessment & Plan:    1. Prostate cancer -PSA pending  - If it remains at nadir, we will continue to follow biannually  2. ED - Continue sildenafil  100 mg, on-demand dosing  3. Urgency - mild and not bothersome  - Continue tamsulosin  0.4 mg daily  Return in about 6 months (around 05/25/2025) for PSA, I PSS, SHIM.  [name]   [facility] [address], [address] [place], [place] [address] [phone]  "

## 2024-11-25 ENCOUNTER — Encounter: Payer: Self-pay | Admitting: Urology

## 2024-11-25 ENCOUNTER — Ambulatory Visit (INDEPENDENT_AMBULATORY_CARE_PROVIDER_SITE_OTHER): Admitting: Urology

## 2024-11-25 VITALS — BP 148/84 | HR 72 | Wt 256.0 lb

## 2024-11-25 DIAGNOSIS — E039 Hypothyroidism, unspecified: Secondary | ICD-10-CM | POA: Insufficient documentation

## 2024-11-25 DIAGNOSIS — E66812 Obesity, class 2: Secondary | ICD-10-CM | POA: Insufficient documentation

## 2024-11-25 DIAGNOSIS — N529 Male erectile dysfunction, unspecified: Secondary | ICD-10-CM

## 2024-11-25 DIAGNOSIS — C61 Malignant neoplasm of prostate: Secondary | ICD-10-CM | POA: Diagnosis not present

## 2024-11-25 DIAGNOSIS — N401 Enlarged prostate with lower urinary tract symptoms: Secondary | ICD-10-CM | POA: Insufficient documentation

## 2024-11-25 DIAGNOSIS — R3915 Urgency of urination: Secondary | ICD-10-CM

## 2024-11-25 DIAGNOSIS — K219 Gastro-esophageal reflux disease without esophagitis: Secondary | ICD-10-CM | POA: Insufficient documentation

## 2024-11-25 MED ORDER — SILDENAFIL CITRATE 100 MG PO TABS: 100.0000 mg | ORAL_TABLET | Freq: Every day | ORAL | 11 refills | Status: AC | PRN

## 2024-11-25 MED ORDER — TAMSULOSIN HCL 0.4 MG PO CAPS: 0.4000 mg | ORAL_CAPSULE | Freq: Every day | ORAL | 11 refills | Status: AC

## 2024-11-26 ENCOUNTER — Ambulatory Visit: Payer: Self-pay | Admitting: Urology

## 2024-11-26 ENCOUNTER — Other Ambulatory Visit: Payer: Self-pay

## 2024-11-26 DIAGNOSIS — C61 Malignant neoplasm of prostate: Secondary | ICD-10-CM

## 2024-11-26 LAB — PSA: Prostate Specific Ag, Serum: <0.1 ng/mL (ref 0.0–4.0)

## 2024-11-27 NOTE — Telephone Encounter (Signed)
 Advised patient that PSA remains undetectable and to keep him appointment in July. Patient verbalized understanding.   Andrea Kirks LPN

## 2024-11-27 NOTE — Telephone Encounter (Signed)
-----   Message from [facility] sent at 11/26/2024 12:41 PM EST ----- Would you let Jack Welch know that his PSA remains undetectable?  This is good news!  We will see him in July.

## 2024-12-03 ENCOUNTER — Encounter: Payer: Self-pay | Admitting: Urology

## 2025-05-21 ENCOUNTER — Ambulatory Visit: Admitting: Urology

## 2025-05-25 ENCOUNTER — Ambulatory Visit: Admitting: Urology
